# Patient Record
Sex: Female | Born: 1980 | Race: White | Hispanic: No | Smoking: Current every day smoker
Health system: Southern US, Community
[De-identification: ages and names within clinical notes are randomized; demographics above are authoritative.]

## PROBLEM LIST (undated history)

## (undated) DIAGNOSIS — R112 Nausea with vomiting, unspecified: Secondary | ICD-10-CM

## (undated) DIAGNOSIS — N83292 Other ovarian cyst, left side: Secondary | ICD-10-CM

## (undated) DIAGNOSIS — Z720 Tobacco use: Secondary | ICD-10-CM

## (undated) DIAGNOSIS — Z87442 Personal history of urinary calculi: Secondary | ICD-10-CM

## (undated) DIAGNOSIS — F419 Anxiety disorder, unspecified: Secondary | ICD-10-CM

## (undated) DIAGNOSIS — R928 Other abnormal and inconclusive findings on diagnostic imaging of breast: Secondary | ICD-10-CM

## (undated) DIAGNOSIS — Z9889 Other specified postprocedural states: Secondary | ICD-10-CM

## (undated) DIAGNOSIS — N809 Endometriosis, unspecified: Secondary | ICD-10-CM

## (undated) HISTORY — DX: Anxiety disorder, unspecified: F41.9

## (undated) HISTORY — DX: Other abnormal and inconclusive findings on diagnostic imaging of breast: R92.8

## (undated) HISTORY — DX: Other ovarian cyst, left side: N83.292

## (undated) HISTORY — DX: Tobacco use: Z72.0

---
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
## 2005-05-08 ENCOUNTER — Emergency Department: Payer: Self-pay | Admitting: Internal Medicine

## 2005-05-12 HISTORY — PX: OTHER SURGICAL HISTORY: SHX169

## 2005-08-27 ENCOUNTER — Emergency Department: Payer: Self-pay | Admitting: Emergency Medicine

## 2005-11-03 ENCOUNTER — Observation Stay: Payer: Self-pay

## 2005-11-05 ENCOUNTER — Ambulatory Visit: Payer: Self-pay | Admitting: General Surgery

## 2006-03-10 ENCOUNTER — Inpatient Hospital Stay: Payer: Self-pay | Admitting: Obstetrics and Gynecology

## 2006-03-17 ENCOUNTER — Emergency Department: Payer: Self-pay | Admitting: Internal Medicine

## 2006-05-12 HISTORY — PX: CHOLECYSTECTOMY: SHX55

## 2007-05-20 ENCOUNTER — Ambulatory Visit: Payer: Self-pay | Admitting: Family Medicine

## 2007-06-03 ENCOUNTER — Emergency Department: Payer: Self-pay | Admitting: Emergency Medicine

## 2007-06-03 ENCOUNTER — Other Ambulatory Visit: Payer: Self-pay

## 2008-08-07 ENCOUNTER — Emergency Department: Payer: Self-pay | Admitting: Emergency Medicine

## 2008-10-09 ENCOUNTER — Emergency Department: Payer: Self-pay | Admitting: Emergency Medicine

## 2008-10-13 ENCOUNTER — Ambulatory Visit: Payer: Self-pay | Admitting: Surgery

## 2009-01-26 ENCOUNTER — Emergency Department: Payer: Self-pay | Admitting: Emergency Medicine

## 2009-04-06 ENCOUNTER — Emergency Department: Payer: Self-pay | Admitting: Emergency Medicine

## 2014-05-12 HISTORY — PX: OTHER SURGICAL HISTORY: SHX169

## 2014-05-25 ENCOUNTER — Ambulatory Visit: Payer: Self-pay | Admitting: Family Medicine

## 2014-10-10 ENCOUNTER — Emergency Department
Admission: EM | Admit: 2014-10-10 | Discharge: 2014-10-10 | Disposition: A | Discharge status: Home / Self Care | Payer: Medicaid Other | Attending: Emergency Medicine | Admitting: Emergency Medicine

## 2014-10-10 ENCOUNTER — Encounter: Payer: Self-pay | Admitting: Emergency Medicine

## 2014-10-10 DIAGNOSIS — Z720 Tobacco use: Secondary | ICD-10-CM | POA: Diagnosis not present

## 2014-10-10 DIAGNOSIS — N200 Calculus of kidney: Secondary | ICD-10-CM | POA: Diagnosis not present

## 2014-10-10 DIAGNOSIS — Z880 Allergy status to penicillin: Secondary | ICD-10-CM | POA: Insufficient documentation

## 2014-10-10 DIAGNOSIS — R109 Unspecified abdominal pain: Secondary | ICD-10-CM | POA: Diagnosis present

## 2014-10-10 LAB — CBC WITH DIFFERENTIAL/PLATELET
BASOS PCT: 0 %
Basophils Absolute: 0 10*3/uL (ref 0–0.1)
Eosinophils Absolute: 0.1 10*3/uL (ref 0–0.7)
Eosinophils Relative: 1 %
HCT: 41 % (ref 35.0–47.0)
HEMOGLOBIN: 13.4 g/dL (ref 12.0–16.0)
LYMPHS ABS: 0.8 10*3/uL — AB (ref 1.0–3.6)
Lymphocytes Relative: 12 %
MCH: 29 pg (ref 26.0–34.0)
MCHC: 32.7 g/dL (ref 32.0–36.0)
MCV: 88.8 fL (ref 80.0–100.0)
Monocytes Absolute: 0.5 10*3/uL (ref 0.2–0.9)
Monocytes Relative: 6 %
NEUTROS ABS: 5.9 10*3/uL (ref 1.4–6.5)
Neutrophils Relative %: 81 %
Platelets: 207 10*3/uL (ref 150–440)
RBC: 4.62 MIL/uL (ref 3.80–5.20)
RDW: 14.4 % (ref 11.5–14.5)
WBC: 7.3 10*3/uL (ref 3.6–11.0)

## 2014-10-10 LAB — URINALYSIS COMPLETE WITH MICROSCOPIC (ARMC ONLY)
BILIRUBIN URINE: NEGATIVE
Bacteria, UA: NONE SEEN
Glucose, UA: NEGATIVE mg/dL
KETONES UR: NEGATIVE mg/dL
Leukocytes, UA: NEGATIVE
Nitrite: NEGATIVE
PH: 6 (ref 5.0–8.0)
Protein, ur: 30 mg/dL — AB
Specific Gravity, Urine: 1.02 (ref 1.005–1.030)

## 2014-10-10 LAB — COMPREHENSIVE METABOLIC PANEL
ALK PHOS: 35 U/L — AB (ref 38–126)
ALT: 14 U/L (ref 14–54)
ANION GAP: 10 (ref 5–15)
AST: 25 U/L (ref 15–41)
Albumin: 4.5 g/dL (ref 3.5–5.0)
BILIRUBIN TOTAL: 0.6 mg/dL (ref 0.3–1.2)
BUN: 17 mg/dL (ref 6–20)
CHLORIDE: 108 mmol/L (ref 101–111)
CO2: 22 mmol/L (ref 22–32)
Calcium: 10 mg/dL (ref 8.9–10.3)
Creatinine, Ser: 0.88 mg/dL (ref 0.44–1.00)
GFR calc non Af Amer: >60 mL/min (ref >60)
Glucose, Bld: 154 mg/dL — ABNORMAL HIGH (ref 65–99)
POTASSIUM: 3.3 mmol/L — AB (ref 3.5–5.1)
SODIUM: 140 mmol/L (ref 135–145)
Total Protein: 7.8 g/dL (ref 6.5–8.1)

## 2014-10-10 LAB — LIPASE, BLOOD: Lipase: 44 U/L (ref 22–51)

## 2014-10-10 MED ORDER — ONDANSETRON HCL 4 MG/2ML IJ SOLN
4.0000 mg | Freq: Once | INTRAMUSCULAR | Status: AC
  Administered 2014-10-10: 4 mg via INTRAVENOUS

## 2014-10-10 MED ORDER — MORPHINE SULFATE 4 MG/ML IJ SOLN
INTRAMUSCULAR | Status: AC
  Filled 2014-10-10: qty 1

## 2014-10-10 MED ORDER — MORPHINE SULFATE 4 MG/ML IJ SOLN: 4.0000 mg | Freq: Once | INTRAMUSCULAR | Status: DC

## 2014-10-10 MED ORDER — ONDANSETRON HCL 4 MG/2ML IJ SOLN
INTRAMUSCULAR | Status: AC
  Administered 2014-10-10: 4 mg via INTRAVENOUS
  Filled 2014-10-10: qty 2

## 2014-10-10 NOTE — ED Provider Notes (Signed)
[facility] Emergency Department Provider Note  ____________________________________________  Time seen: On arrival  I have reviewed the triage vital signs and the nursing notes.   HISTORY  Chief Complaint Abdominal Pain      HPI Taylor Schwartz is a 34 y.o. female who presents with right flank pain. The pain started approximately 6 hours prior to arrival while patient was sleeping. It began abruptly and is noted to be very sharp in nature. The pain comes and goes in intensity but when severe she feels nauseous.No fevers no chills. No history of the same. She has had a cholecystectomy. Nothing seems to make the pain better or worse     History reviewed. No pertinent past medical history.  There are no active problems to display for this patient.   Past Surgical History  Procedure Laterality Date  . Cholecystectomy      No current outpatient prescriptions on file.  Allergies Amoxicillin; Mobic; and Penicillins  No family history on file.  Social History History  Substance Use Topics  . Smoking status: Current Every Day Smoker  . Smokeless tobacco: Not on file  . Alcohol Use: No    Review of Systems  Constitutional: Negative for fever. Eyes: Negative for visual changes. ENT: Negative for sore throat Cardiovascular: Negative for chest pain. Respiratory: Negative for shortness of breath. Gastrointestinal: Positive for flank pain Genitourinary: Negative for dysuria. Musculoskeletal: Negative for back pain. Skin: Negative for rash. Neurological: Negative for headaches or focal weakness   10-point ROS otherwise negative.  ____________________________________________   PHYSICAL EXAM:  VITAL SIGNS: ED Triage Vitals  Enc Vitals Group     BP 10/10/14 0906 110/73 mmHg     Pulse Rate 10/10/14 0906 79     Resp 10/10/14 0906 18     Temp 10/10/14 0906 97.5 F (36.4 C)     Temp Source 10/10/14 0906 Oral     SpO2 10/10/14 0906 100 %      Weight 10/10/14 0906 134 lb (60.782 kg)     Height 10/10/14 0906 5\' 5"  (1.651 m)     Head Cir --      Peak Flow --      Pain Score 10/10/14 0912 10     Pain Loc --      Pain Edu? --      Excl. in [place]? --      Constitutional: Alert and oriented. Well appearing but uncomfortable Eyes: Conjunctivae are normal. PERRL. ENT   Head: Normocephalic and atraumatic.   Nose: No rhinnorhea.   Mouth/Throat: Mucous membranes are moist. Cardiovascular: Normal rate, regular rhythm. Normal and symmetric distal pulses are present in all extremities. No murmurs, rubs, or gallops. Respiratory: Normal respiratory effort without tachypnea nor retractions. Breath sounds are clear and equal bilaterally.  Gastrointestinal: Soft and non-tender in all quadrants. No distention. There is no CVA tenderness. Genitourinary: deferred Musculoskeletal: Nontender with normal range of motion in all extremities. No lower extremity tenderness nor edema. Neurologic:  Normal speech and language. No gross focal neurologic deficits are appreciated. Skin:  Skin is warm, dry and intact. No rash noted. Psychiatric: Mood and affect are normal. Patient exhibits appropriate insight and judgment.  ____________________________________________    LABS (pertinent positives/negatives)  Labs Reviewed  COMPREHENSIVE METABOLIC PANEL - Abnormal; Notable for the following:    Potassium 3.3 (*)    Glucose, Bld 154 (*)    Alkaline Phosphatase 35 (*)    All other components within normal limits  URINALYSIS COMPLETEWITH  MICROSCOPIC ([facility] ONLY) - Abnormal; Notable for the following:    Color, Urine YELLOW (*)    APPearance HAZY (*)    Hgb urine dipstick 3+ (*)    Protein, ur 30 (*)    Squamous Epithelial / LPF 0-5 (*)    All other components within normal limits  CBC WITH DIFFERENTIAL/PLATELET - Abnormal; Notable for the following:    Lymphs Abs 0.8 (*)    All other components within normal limits  LIPASE, BLOOD     ____________________________________________   EKG  None  ____________________________________________    RADIOLOGY  None  ____________________________________________   PROCEDURES  Procedure(s) performed: none  Critical Care performed: none  ____________________________________________   INITIAL IMPRESSION / ASSESSMENT AND PLAN / ED COURSE  Pertinent labs & imaging results that were available during my care of the patient were reviewed by me and considered in my medical decision making (see chart for details).  While in the emergency department patient urinated and the pain improved. Kidney stone noted in urine  ____________________________________________   FINAL CLINICAL IMPRESSION(S) / ED DIAGNOSES  Final diagnoses:  Kidney stone     Lavonia Drafts, MD 10/10/14 1537

## 2014-10-10 NOTE — Discharge Instructions (Signed)

## 2014-10-10 NOTE — ED Notes (Signed)
Pt with acute onset RUQ pain started this am  With vomiting and diarrhea.

## 2014-10-17 ENCOUNTER — Telehealth: Payer: Self-pay | Admitting: Family Medicine

## 2014-10-17 DIAGNOSIS — F418 Other specified anxiety disorders: Secondary | ICD-10-CM

## 2014-10-17 MED ORDER — ALPRAZOLAM 0.5 MG PO TABS: 0.5000 mg | ORAL_TABLET | Freq: Two times a day (BID) | ORAL | Status: DC | PRN

## 2014-10-17 NOTE — Telephone Encounter (Signed)
FAX received regarding refill for alprazolam 0.5mg  one po bid, quantity #60 with 5 refills RX printed out and ready for patient to pick up please notify patient and close this note.

## 2014-10-17 NOTE — Telephone Encounter (Signed)
Patient is informed and will be by tomorrow to pick up her rx.

## 2015-07-06 DIAGNOSIS — K219 Gastro-esophageal reflux disease without esophagitis: Secondary | ICD-10-CM | POA: Insufficient documentation

## 2015-07-06 DIAGNOSIS — M5412 Radiculopathy, cervical region: Secondary | ICD-10-CM | POA: Insufficient documentation

## 2015-07-06 DIAGNOSIS — F339 Major depressive disorder, recurrent, unspecified: Secondary | ICD-10-CM | POA: Insufficient documentation

## 2015-07-06 DIAGNOSIS — D229 Melanocytic nevi, unspecified: Secondary | ICD-10-CM | POA: Insufficient documentation

## 2016-01-28 DIAGNOSIS — N6012 Diffuse cystic mastopathy of left breast: Secondary | ICD-10-CM | POA: Insufficient documentation

## 2016-04-15 ENCOUNTER — Other Ambulatory Visit: Payer: Self-pay | Admitting: Obstetrics and Gynecology

## 2016-05-12 DIAGNOSIS — R928 Other abnormal and inconclusive findings on diagnostic imaging of breast: Secondary | ICD-10-CM

## 2016-05-12 DIAGNOSIS — N809 Endometriosis, unspecified: Secondary | ICD-10-CM

## 2016-05-12 HISTORY — DX: Other abnormal and inconclusive findings on diagnostic imaging of breast: R92.8

## 2016-05-12 HISTORY — PX: BACK SURGERY: SHX140

## 2016-05-12 HISTORY — PX: BREAST SURGERY: SHX581

## 2016-05-12 HISTORY — DX: Endometriosis, unspecified: N80.9

## 2016-07-01 ENCOUNTER — Encounter: Payer: Self-pay | Admitting: Family Medicine

## 2016-07-01 ENCOUNTER — Ambulatory Visit (INDEPENDENT_AMBULATORY_CARE_PROVIDER_SITE_OTHER): Payer: Medicaid Other | Admitting: Family Medicine

## 2016-07-01 ENCOUNTER — Emergency Department: Payer: Medicaid Other

## 2016-07-01 ENCOUNTER — Emergency Department
Admission: EM | Admit: 2016-07-01 | Discharge: 2016-07-01 | Disposition: A | Discharge status: Home / Self Care | Payer: Medicaid Other | Attending: Emergency Medicine | Admitting: Emergency Medicine

## 2016-07-01 ENCOUNTER — Encounter: Payer: Self-pay | Admitting: Medical Oncology

## 2016-07-01 VITALS — BP 118/80 | HR 99 | Temp 99.2°F | Resp 16 | Wt 147.3 lb

## 2016-07-01 DIAGNOSIS — F17200 Nicotine dependence, unspecified, uncomplicated: Secondary | ICD-10-CM | POA: Diagnosis not present

## 2016-07-01 DIAGNOSIS — R1031 Right lower quadrant pain: Secondary | ICD-10-CM | POA: Diagnosis not present

## 2016-07-01 DIAGNOSIS — K529 Noninfective gastroenteritis and colitis, unspecified: Secondary | ICD-10-CM | POA: Diagnosis not present

## 2016-07-01 DIAGNOSIS — R109 Unspecified abdominal pain: Secondary | ICD-10-CM

## 2016-07-01 LAB — COMPREHENSIVE METABOLIC PANEL
ALK PHOS: 30 U/L — AB (ref 38–126)
ALT: 12 U/L — ABNORMAL LOW (ref 14–54)
AST: 21 U/L (ref 15–41)
Albumin: 4.1 g/dL (ref 3.5–5.0)
Anion gap: 7 (ref 5–15)
BUN: 12 mg/dL (ref 6–20)
CALCIUM: 9.3 mg/dL (ref 8.9–10.3)
CO2: 22 mmol/L (ref 22–32)
Chloride: 108 mmol/L (ref 101–111)
Creatinine, Ser: 0.77 mg/dL (ref 0.44–1.00)
GFR calc Af Amer: >60 mL/min (ref >60)
GFR calc non Af Amer: >60 mL/min (ref >60)
Glucose, Bld: 97 mg/dL (ref 65–99)
Potassium: 3.5 mmol/L (ref 3.5–5.1)
Sodium: 137 mmol/L (ref 135–145)
Total Bilirubin: 0.3 mg/dL (ref 0.3–1.2)
Total Protein: 7.4 g/dL (ref 6.5–8.1)

## 2016-07-01 LAB — URINALYSIS, COMPLETE (UACMP) WITH MICROSCOPIC
Bacteria, UA: NONE SEEN
Bilirubin Urine: NEGATIVE
Glucose, UA: NEGATIVE mg/dL
Ketones, ur: 20 mg/dL — AB
Nitrite: NEGATIVE
PROTEIN: NEGATIVE mg/dL
Specific Gravity, Urine: 1.023 (ref 1.005–1.030)
pH: 5 (ref 5.0–8.0)

## 2016-07-01 LAB — CBC
HCT: 38.2 % (ref 35.0–47.0)
Hemoglobin: 12.8 g/dL (ref 12.0–16.0)
MCH: 29 pg (ref 26.0–34.0)
MCHC: 33.5 g/dL (ref 32.0–36.0)
MCV: 86.5 fL (ref 80.0–100.0)
Platelets: 286 10*3/uL (ref 150–440)
RBC: 4.42 MIL/uL (ref 3.80–5.20)
RDW: 13.5 % (ref 11.5–14.5)
WBC: 11.6 10*3/uL — ABNORMAL HIGH (ref 3.6–11.0)

## 2016-07-01 LAB — LIPASE, BLOOD: Lipase: 29 U/L (ref 11–51)

## 2016-07-01 LAB — LACTIC ACID, PLASMA: Lactic Acid, Venous: 0.8 mmol/L (ref 0.5–1.9)

## 2016-07-01 LAB — POCT PREGNANCY, URINE: PREG TEST UR: NEGATIVE

## 2016-07-01 MED ORDER — SODIUM CHLORIDE 0.9 % IV BOLUS (SEPSIS)
1000.0000 mL | INTRAVENOUS | Status: AC
  Administered 2016-07-01: 1000 mL via INTRAVENOUS

## 2016-07-01 MED ORDER — ONDANSETRON 4 MG PO TBDP: 4.0000 mg | ORAL_TABLET | Freq: Three times a day (TID) | ORAL | 0 refills | Status: DC | PRN

## 2016-07-01 MED ORDER — IOPAMIDOL (ISOVUE-300) INJECTION 61%
30.0000 mL | Freq: Once | INTRAVENOUS | Status: AC | PRN
  Administered 2016-07-01: 30 mL via ORAL
  Filled 2016-07-01: qty 30

## 2016-07-01 MED ORDER — IOPAMIDOL (ISOVUE-300) INJECTION 61%
100.0000 mL | Freq: Once | INTRAVENOUS | Status: AC | PRN
  Administered 2016-07-01: 100 mL via INTRAVENOUS
  Filled 2016-07-01: qty 100

## 2016-07-01 MED ORDER — HYDROCODONE-ACETAMINOPHEN 5-325 MG PO TABS: 1.0000 | ORAL_TABLET | ORAL | 0 refills | Status: DC | PRN

## 2016-07-01 MED ORDER — ONDANSETRON HCL 4 MG/2ML IJ SOLN
4.0000 mg | INTRAMUSCULAR | Status: AC
  Administered 2016-07-01: 4 mg via INTRAVENOUS
  Filled 2016-07-01: qty 2

## 2016-07-01 MED ORDER — MORPHINE SULFATE (PF) 4 MG/ML IV SOLN
4.0000 mg | Freq: Once | INTRAVENOUS | Status: AC
  Administered 2016-07-01: 4 mg via INTRAVENOUS
  Filled 2016-07-01: qty 1

## 2016-07-01 NOTE — Progress Notes (Signed)
erronous encounter

## 2016-07-01 NOTE — ED Triage Notes (Signed)
Pt reports she was sent here from PCP to be evaluated for appendicitis, pt states that she began having rt lower abd pain and fever Thursday. Denies dysuria.

## 2016-07-01 NOTE — ED Provider Notes (Signed)
[facility] Emergency Department Provider Note  ____________________________________________   First MD Initiated Contact with Patient 07/01/16 1624     (approximate)  I have reviewed the triage vital signs and the nursing notes.   HISTORY  Chief Complaint Abdominal Pain and Fever    HPI Taylor Schwartz is a 36 y.o. female with a history of kidney stones who presents for evaluation of about 5 days of gradually worsening right-sided abdominal pain with decreased appetite, decreased oral intake, nausea, vomiting, and fever as high as 104 measured at home.  She states that the pain started around her belly button and has migrated down to her right lower quadrant.  The pain is mild when she presses but when somebody presses and then lets go, the pain is severe.  She says that she has no appetite but whenever she does eat she becomes very nauseated and occasionally vomits.  Her fever is helped somewhat by 800 mg ibuprofen and Tylenol, but then the fever comes back.  She went to see her primary care doctor today and after the evaluation was told to go immediately to the emergency department.  She reports that she was febrile at her doctor's office but she was not in triage.  She denies chest pain, shortness of breath, dysuria, hematuria.  Movement makes the pain much worse and nothing makes it better.  It feels sharp and stabbing as well as aching and is severe.  History reviewed. No pertinent past medical history.  Patient Active Problem List   Diagnosis Date Noted  . Depression with anxiety 10/17/2014    Past Surgical History:  Procedure Laterality Date  . CHOLECYSTECTOMY      Prior to Admission medications   Medication Sig Start Date End Date Taking? Authorizing Provider  ALPRAZolam Duanne Moron) 0.5 MG tablet Take 1 tablet (0.5 mg total) by mouth 2 (two) times daily as needed for anxiety. 10/17/14   Bobetta Lime, MD    Allergies Amoxicillin; Mobic  [meloxicam]; and Penicillins  No family history on file.  Social History Social History  Substance Use Topics  . Smoking status: Current Every Day Smoker  . Smokeless tobacco: Never Used  . Alcohol use No    Review of Systems Constitutional: +fever/chills, Tmax of 104 Eyes: No visual changes. ENT: No sore throat. Cardiovascular: Denies chest pain. Respiratory: Denies shortness of breath. Gastrointestinal: Abd pain that started around the umbilicus and has migrated to the RLQ.  +N/V.  +Anorexia Genitourinary: Negative for dysuria. Musculoskeletal: Negative for back pain. Skin: Negative for rash. Neurological: Negative for headaches, focal weakness or numbness.  10-point ROS otherwise negative.  ____________________________________________   PHYSICAL EXAM:  VITAL SIGNS: ED Triage Vitals  Enc Vitals Group     BP 07/01/16 1500 132/68     Pulse Rate 07/01/16 1500 92     Resp 07/01/16 1500 18     Temp 07/01/16 1500 98.7 F (37.1 C)     Temp Source 07/01/16 1500 Oral     SpO2 07/01/16 1500 100 %     Weight 07/01/16 1500 147 lb (66.7 kg)     Height 07/01/16 1500 5\' 6"  (1.676 m)     Head Circumference --      Peak Flow --      Pain Score 07/01/16 1501 8     Pain Loc --      Pain Edu? --      Excl. in [place]? --     Constitutional: Alert and oriented.  Appear uncomfortable but non-toxic Eyes: Conjunctivae are normal. PERRL. EOMI. Head: Atraumatic. Nose: No congestion/rhinnorhea. Mouth/Throat: Mucous membranes are moist. Neck: No stridor.  No meningeal signs.   Cardiovascular: Borderline tachycardia, regular rhythm. Good peripheral circulation. Grossly normal heart sounds. Respiratory: Normal respiratory effort.  No retractions. Lungs CTAB. Gastrointestinal: Soft with moderate tenderness to palpation of the RLQ but severe rebound tenderness.  +Rovsings.  Also has +Murphy's sign  in RUQ, but has history of cholecystectomy about 8 or 9 years ago Genitourinary:  Deferred Musculoskeletal: No lower extremity tenderness nor edema. No gross deformities of extremities. Neurologic:  Normal speech and language. No gross focal neurologic deficits are appreciated.  Skin:  Skin is warm, dry and intact. No rash noted. Psychiatric: Mood and affect are normal. Speech and behavior are normal.  ____________________________________________   LABS (all labs ordered are listed, but only abnormal results are displayed)  Labs Reviewed  COMPREHENSIVE METABOLIC PANEL - Abnormal; Notable for the following:       Result Value   ALT 12 (*)    Alkaline Phosphatase 30 (*)    All other components within normal limits  CBC - Abnormal; Notable for the following:    WBC 11.6 (*)    All other components within normal limits  URINALYSIS, COMPLETE (UACMP) WITH MICROSCOPIC - Abnormal; Notable for the following:    Color, Urine YELLOW (*)    APPearance CLEAR (*)    Hgb urine dipstick SMALL (*)    Ketones, ur 20 (*)    Leukocytes, UA TRACE (*)    Squamous Epithelial / LPF 0-5 (*)    All other components within normal limits  LIPASE, BLOOD  LACTIC ACID, PLASMA  LACTIC ACID, PLASMA  POC URINE PREG, ED  POCT PREGNANCY, URINE   ____________________________________________  EKG  None - EKG not ordered by ED physician ____________________________________________  RADIOLOGY   No results found.  ____________________________________________   PROCEDURES  Procedure(s) performed:   Procedures   Critical Care performed: No ____________________________________________   INITIAL IMPRESSION / ASSESSMENT AND PLAN / ED COURSE  Pertinent labs & imaging results that were available during my care of the patient were reviewed by me and considered in my medical decision making (see chart for details).  The patient has a heart rate greater than 90 but she is not technically tachycardic.  She has a very mild leukocytosis technically because it is over 11 but I do not  believe she qualifies for sepsis at this point.  However I am concerned about her focal peritonitis in the right lower quadrant.  She does also have significant tenderness to palpation of the right upper quadrant but she is status post cholecystectomy nearly a decade ago and that makes a diagnosis such as cholangitis incredibly unlikely given the limited possibility of retained stone after so long.  Appendicitis plus or minus perforation is much more likely.  I have asked to send a lactic acid and will provide IV fluids, morphine, Zofran, and we will evaluate with CT scan with oral and IV contrast.  Transferring ED care to Dr. Kerman Passey at 5:00pm to follow up on imaging and reassess.    ____________________________________________  FINAL CLINICAL IMPRESSION(S) / ED DIAGNOSES  Final diagnoses:  None     MEDICATIONS GIVEN DURING THIS VISIT:  Medications  morphine 4 MG/ML injection 4 mg (4 mg Intravenous Given 07/01/16 1702)  ondansetron (ZOFRAN) injection 4 mg (4 mg Intravenous Given 07/01/16 1700)  sodium chloride 0.9 % bolus 1,000 mL (1,000 mLs Intravenous  New Bag/Given 07/01/16 1658)  iopamidol (ISOVUE-300) 61 % injection 30 mL (30 mLs Oral Contrast Given 07/01/16 1642)     NEW OUTPATIENT MEDICATIONS STARTED DURING THIS VISIT:  New Prescriptions   No medications on file    Modified Medications   No medications on file    Discontinued Medications   No medications on file     Note:  This document was prepared using Dragon voice recognition software and may include unintentional dictation errors.    Hinda Kehr, MD 07/01/16 [phone]

## 2016-07-01 NOTE — Progress Notes (Signed)
   BP 118/80   Pulse 99   Temp 99.2 F (37.3 C) (Oral)   Resp 16   Wt 147 lb 4.8 oz (66.8 kg)   LMP 06/13/2016   SpO2 96%   BMI 24.51 kg/m    Subjective:    Patient ID: Taylor Schwartz, female    DOB: 04-21-1981, 36 y.o.   MRN: [id number]  HPI: Taylor Schwartz is a 36 y.o. female  Chief Complaint  Patient presents with  . Abdominal Pain    more RLQ has had urology workup, pain has gotten worse has also been having    Patient was worked up by the Psychologist, sport and exercise, and came to get me quickly when she realized that this patient may have appendicitis or other significant illness Patient in the room, c/o of significant abdominal pain, periumbilical and RLQ; she says it really hurts when "letting go", worse than when pushing down  Relevant past surgical, social history reviewed No past medical history on file.   Past Surgical History:  Procedure Laterality Date  . CHOLECYSTECTOMY     No family history on file.   Social History  Substance Use Topics  . Smoking status: Current Every Day Smoker  . Smokeless tobacco: Never Used  . Alcohol use No   Interim medical history since last visit reviewed. Allergies and medications reviewed  Review of Systems Per HPI unless specifically indicated above     Objective:    BP 118/80   Pulse 99   Temp 99.2 F (37.3 C) (Oral)   Resp 16   Wt 147 lb 4.8 oz (66.8 kg)   LMP 06/13/2016   SpO2 96%   BMI 24.51 kg/m   Wt Readings from Last 3 Encounters:  07/01/16 147 lb (66.7 kg)  07/01/16 147 lb 4.8 oz (66.8 kg)  10/10/14 134 lb (60.8 kg)    Physical Exam  Constitutional: She appears well-developed and well-nourished.  Appears to be ill, in obvious discomfort, but nontoxic  Cardiovascular: Normal rate.   Pulmonary/Chest: Effort normal.  Abdominal: She exhibits no distension. There is tenderness in the right lower quadrant and periumbilical area. There is guarding.  Skin: She is not diaphoretic.      Assessment & Plan:    Problem List Items Addressed This Visit    None    Visit Diagnoses    Right lower quadrant abdominal pain    -  Primary   and periumbilical; ddx includes acute appendicitis; patient urged to go straight to ER; call report given; she reported fine to drive, declined EMS, ride      Follow up plan: No Follow-up on file.

## 2016-07-01 NOTE — ED Provider Notes (Signed)
-----------------------------------------   6:31 PM on 07/01/2016 -----------------------------------------  Patient's workup is largely within normal limits besides a slight leukocytosis. CT scan is negative showing a normal appendix. Lactic acid is normal. Highly suspect GI viral illness. We will discharge with a short course of Norco as well as Zofran. I discussed supportive care at home.   Harvest Dark, MD 07/01/16 [phone]

## 2016-07-04 ENCOUNTER — Encounter: Payer: Self-pay | Admitting: Family Medicine

## 2016-07-04 ENCOUNTER — Ambulatory Visit (INDEPENDENT_AMBULATORY_CARE_PROVIDER_SITE_OTHER): Payer: Medicaid Other | Admitting: Family Medicine

## 2016-07-04 DIAGNOSIS — R109 Unspecified abdominal pain: Secondary | ICD-10-CM | POA: Insufficient documentation

## 2016-07-04 DIAGNOSIS — R1031 Right lower quadrant pain: Secondary | ICD-10-CM

## 2016-07-04 DIAGNOSIS — R197 Diarrhea, unspecified: Secondary | ICD-10-CM | POA: Insufficient documentation

## 2016-07-04 DIAGNOSIS — Z720 Tobacco use: Secondary | ICD-10-CM | POA: Diagnosis not present

## 2016-07-04 DIAGNOSIS — A09 Infectious gastroenteritis and colitis, unspecified: Secondary | ICD-10-CM | POA: Diagnosis not present

## 2016-07-04 DIAGNOSIS — N83292 Other ovarian cyst, left side: Secondary | ICD-10-CM | POA: Diagnosis not present

## 2016-07-04 HISTORY — DX: Tobacco use: Z72.0

## 2016-07-04 HISTORY — DX: Other ovarian cyst, left side: N83.292

## 2016-07-04 MED ORDER — RANITIDINE HCL 150 MG PO TABS: 150.0000 mg | ORAL_TABLET | Freq: Two times a day (BID) | ORAL | 0 refills | Status: DC

## 2016-07-04 MED ORDER — HYOSCYAMINE SULFATE SL 0.125 MG SL SUBL: SUBLINGUAL_TABLET | SUBLINGUAL | 0 refills | Status: DC

## 2016-07-04 NOTE — Assessment & Plan Note (Signed)
Patient is not ready to quit; I planted the seed, I am here to help if/when needed

## 2016-07-04 NOTE — Assessment & Plan Note (Signed)
Refer to GYN. 

## 2016-07-04 NOTE — Assessment & Plan Note (Signed)
Will get stool studies

## 2016-07-04 NOTE — Progress Notes (Signed)
BP 118/67   Pulse 98   Temp 98.9 F (37.2 C) (Oral)   Resp 14   Wt 141 lb 9.6 oz (64.2 kg)   LMP 06/13/2016 Comment: neg preg test 07/01/16  SpO2 99%   BMI 22.85 kg/m    Subjective:    Patient ID: Taylor Schwartz, female    DOB: 06-21-1980, 36 y.o.   MRN: [id number]  HPI: Taylor Schwartz is a 36 y.o. female  Chief Complaint  Patient presents with  . Follow-up    ER   Patient was seen in the ER on Jul 01, 2016, had CT abdomen and pelvis Note from ER says that patient likely had GI viral illness Sent home with norco and zofran She was seen here but then was quickly transferred to the ER because of her clinical exam (rebound) Symptoms started a week ago; she is having diarrhea Rest of the family is doing well, no one else at home is sick No vomiting but on the zofran Last fever was Saturday, 99 to 100 low grade; not taking ibuprofen; stomach cramping; maybe nothing there Rolling and gurgling Things just go through her, 2-3 x a day; no blood or mucous Stool is greenish in color Right after she eats, then 1-2 hours later, things will go through her Cholecystectomy 8 years ago  ------------------------------------------ Reproductive: Uterus unremarkable. Small cyst in the right ovary unchanged. Hypodense 2.5 cm probable complicated cyst left ovary, unchanged compared with December 2017 CT.  IMPRESSION: 1. No CT evidence for acute intra-abdominal or pelvic pathology. Normal appendix. 2. Status post cholecystectomy. Mild intra hepatic biliary dilatation with prominent extrahepatic common bile duct up to 1 cm, likely related to post cholecystectomy changes. Consider correlation with laboratory values. 3. Punctate nonobstructing stone mid right kidney.   Electronically Signed   By: Donavan Foil M.D.   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 Anxiety   . Complex cyst of left ovary 07/04/2016  . Kidney stones   . Tobacco abuse 07/04/2016   Past Surgical History:  Procedure Laterality Date  . CHOLECYSTECTOMY     Family History  Problem Relation Age of Onset  . Lupus Mother   . Arthritis Mother   . Glaucoma Mother   . Vision loss Mother   . Diabetes Mother   . Cancer Father     prostate  . Diabetes Father   . Diabetes Maternal Grandmother   . Dementia Maternal Grandmother   . Diabetes Maternal Grandfather   . Pneumonia Maternal Grandfather   . Diabetes Paternal Grandmother   . Diabetes Paternal Grandfather    Social History  Substance Use Topics  . Smoking status: Current Every Day Smoker  . Smokeless tobacco:  Never Used  . Alcohol use No   Interim medical history since last visit reviewed. Allergies and medications reviewed  Review of Systems Per HPI unless specifically indicated above     Objective:    BP 118/67   Pulse 98   Temp 98.9 F (37.2 C) (Oral)   Resp 14   Wt 141 lb 9.6 oz (64.2 kg)   LMP 06/13/2016 Comment: neg preg test 07/01/16  SpO2 99%   BMI 22.85 kg/m   Wt Readings from Last 3 Encounters:  07/04/16 141 lb 9.6 oz (64.2 kg)  07/01/16 147 lb (66.7 kg)  07/01/16 147 lb 4.8 oz (66.8 kg)    Physical Exam  Constitutional: She appears well-developed and  well-nourished. No distress.  Weight loss six pounds  HENT:  Head: Normocephalic and atraumatic.  Eyes: EOM are normal. No scleral icterus.  Neck: No thyromegaly present.  Cardiovascular: Normal rate, regular rhythm and normal heart sounds.   No murmur heard. Pulmonary/Chest: Effort normal and breath sounds normal. No respiratory distress. She has no wheezes.  Abdominal: Soft. Bowel sounds are normal. She exhibits no distension. There is tenderness (very mildly tender RLQ, no guarding, no rebound). There is no rebound and no guarding.  Musculoskeletal: Normal range of motion. She exhibits no edema.  Neurological: She is alert.  Skin: Skin is warm and dry. She is not diaphoretic. No pallor.  Psychiatric: She has a normal mood and affect. Her behavior is normal. Judgment and thought content normal.   Results for orders placed or performed during the hospital encounter of 07/01/16  Lipase, blood  Result Value Ref Range   Lipase 29 11 - 51 U/L  Comprehensive metabolic panel  Result Value Ref Range   Sodium 137 135 - 145 mmol/L   Potassium 3.5 3.5 - 5.1 mmol/L   Chloride 108 101 - 111 mmol/L   CO2 22 22 - 32 mmol/L   Glucose, Bld 97 65 - 99 mg/dL   BUN 12 6 - 20 mg/dL   Creatinine, Ser 0.77 0.44 - 1.00 mg/dL   Calcium 9.3 8.9 - 10.3 mg/dL   Total Protein 7.4 6.5 - 8.1 g/dL   Albumin 4.1 3.5 - 5.0 g/dL   AST 21 15 - 41 U/L   ALT 12 (L) 14 - 54 U/L   Alkaline Phosphatase 30 (L) 38 - 126 U/L   Total Bilirubin 0.3 0.3 - 1.2 mg/dL   GFR calc non Af Amer >60 >60 mL/min   GFR calc Af Amer >60 >60 mL/min   Anion gap 7 5 - 15  CBC  Result Value Ref Range   WBC 11.6 (H) 3.6 - 11.0 K/uL   RBC 4.42 3.80 - 5.20 MIL/uL   Hemoglobin 12.8 12.0 - 16.0 g/dL   HCT 38.2 35.0 - 47.0 %   MCV 86.5 80.0 - 100.0 fL   MCH 29.0 26.0 - 34.0 pg   MCHC 33.5 32.0 - 36.0 g/dL   RDW 13.5 11.5 - 14.5 %   Platelets 286 150 - 440 K/uL  Urinalysis, Complete w Microscopic  Result Value Ref Range   Color,  Urine YELLOW (A) YELLOW   APPearance CLEAR (A) CLEAR   Specific Gravity, Urine 1.023 1.005 - 1.030   pH 5.0 5.0 - 8.0   Glucose, UA NEGATIVE NEGATIVE mg/dL   Hgb urine dipstick SMALL (A) NEGATIVE   Bilirubin Urine NEGATIVE NEGATIVE   Ketones, ur 20 (A) NEGATIVE mg/dL   Protein, ur NEGATIVE NEGATIVE mg/dL   Nitrite  NEGATIVE NEGATIVE   Leukocytes, UA TRACE (A) NEGATIVE   RBC / HPF 0-5 0 - 5 RBC/hpf   WBC, UA 0-5 0 - 5 WBC/hpf   Bacteria, UA NONE SEEN NONE SEEN   Squamous Epithelial / LPF 0-5 (A) NONE SEEN   Mucous PRESENT   Lactic acid, plasma  Result Value Ref Range   Lactic Acid, Venous 0.8 0.5 - 1.9 mmol/L  Pregnancy, urine POC  Result Value Ref Range   Preg Test, Ur NEGATIVE NEGATIVE      Assessment & Plan:   Problem List Items Addressed This Visit      Genitourinary   Complex cyst of left ovary    Refer to GYN        Other   Tobacco abuse    Patient is not ready to quit; I planted the seed, I am here to help if/when needed      Diarrhea    Will get stool studies      Relevant Orders   Stool culture   Ova and parasite examination   Abdominal pain    Reviewed CT scans from Feb and Dec with her; much improved relative to three days ago; if viral GE, this should resolve; will get stool studies; complex cyst is on LEFT, not where she feels discomfort; will use ranitidine to help prevent gastritis, ulcer; levsin for cramping; back to ER if worse over weekend; call me Monday if still having symptoms         Follow up plan: No Follow-up on file.  An after-visit summary was printed and given to the patient at [facility].  Please see the patient instructions which may contain other information and recommendations beyond what is mentioned above in the assessment and plan.  Meds ordered this encounter  Medications  . Norgestimate-Ethinyl Estradiol Triphasic 0.18/0.215/0.25 MG-25 MCG tab    Sig: Take 1 tablet by mouth daily.  Marland Kitchen ibuprofen (ADVIL,MOTRIN) 800 MG tablet      Sig: Take 800 mg by mouth as needed.  . ranitidine (ZANTAC) 150 MG tablet    Sig: Take 1 tablet (150 mg total) by mouth 2 (two) times daily.    Dispense:  60 tablet    Refill:  0  . Hyoscyamine Sulfate SL (LEVSIN/SL) 0.125 MG SUBL    Sig: One by mouth every six hours if needed for stomach upset, cramps    Dispense:  30 each    Refill:  0    Orders Placed This Encounter  Procedures  . Stool culture  . Ova and parasite examination

## 2016-07-04 NOTE — Patient Instructions (Signed)
Continue bland / BRAT diet Hydrate Stool studies Ranitidine to help prevent gastritis or an ulcer levsin if needed for stomach cramping Call if needed Back to ER if worsening, dehydrated, unable to keep foods down

## 2016-07-04 NOTE — Assessment & Plan Note (Addendum)
Reviewed CT scans from Feb and Dec with her; much improved relative to three days ago; if viral GE, this should resolve; will get stool studies; complex cyst is on LEFT, not where she feels discomfort; will use ranitidine to help prevent gastritis, ulcer; levsin for cramping; back to ER if worse over weekend; call me Monday if still having symptoms

## 2016-07-10 LAB — OVA AND PARASITE EXAMINATION: OP: NONE SEEN

## 2016-07-11 ENCOUNTER — Telehealth: Payer: Self-pay | Admitting: Family Medicine

## 2016-07-11 NOTE — Telephone Encounter (Signed)
I am so sorry to hear that she is still having such significant symptoms Since she is having "severe" pain, please direct her to go back to the ER now (she did have an elevated white count, could have been smoldering appendix, some other issue that is declaring itself now)

## 2016-07-11 NOTE — Telephone Encounter (Deleted)
-----   Message from Arnetha Courser, MD sent at 07/11/2016  8:07 AM EST ----- Please let patient know that her stool did not show any ova (eggs) or parasites; thank you

## 2016-07-11 NOTE — Telephone Encounter (Signed)
Patient notified and informed her to go to ER

## 2016-07-11 NOTE — Telephone Encounter (Signed)
Patient called wanting to know the result of her recent stool test.  I gave her the results per what Dr. Sanda Klein noted in the results.  Patient stated that she is no longer vomiting or has diarrhea.  She is however still experiencing severe right side pain. She is taking ibuprofen, xantac, and hyoscyamine but the pain has not resolved.  Please advise.

## 2016-07-12 LAB — STOOL CULTURE

## 2016-08-19 ENCOUNTER — Other Ambulatory Visit: Payer: Self-pay

## 2016-08-19 DIAGNOSIS — N83209 Unspecified ovarian cyst, unspecified side: Secondary | ICD-10-CM

## 2016-09-15 ENCOUNTER — Ambulatory Visit (INDEPENDENT_AMBULATORY_CARE_PROVIDER_SITE_OTHER): Payer: Medicaid Other | Admitting: Obstetrics and Gynecology

## 2016-09-15 ENCOUNTER — Encounter: Payer: Self-pay | Admitting: Obstetrics and Gynecology

## 2016-09-15 VITALS — BP 108/54 | HR 83 | Ht 66.0 in | Wt 146.0 lb

## 2016-09-15 DIAGNOSIS — R102 Pelvic and perineal pain: Secondary | ICD-10-CM

## 2016-09-15 DIAGNOSIS — N83202 Unspecified ovarian cyst, left side: Secondary | ICD-10-CM

## 2016-09-15 MED ORDER — NORETHINDRONE ACETATE 5 MG PO TABS: 5.0000 mg | ORAL_TABLET | Freq: Every day | ORAL | 11 refills | Status: DC

## 2016-09-15 NOTE — Progress Notes (Signed)
Obstetrics & Gynecology Office Visit   Chief Complaint:  Chief Complaint  Patient presents with  . Ovarian Cyst    Referred by [facility]    History of Present Illness: The patient is a 36 y.o. female presenting for follow up of right complex ovarian cyst noted on CT scan during work up of abdominal pain by her PCP on 07/01/2016.  The cyst in question measured 2.5cm, no presence of ascites, or lymphadenopathy.  This is read as stable in appearance from CT scan in December (images not available for review but CT scan from [facility] 2016 read as normal).  There is not a notable family history of ovarian cancer, uterine cancer, breast cancer, or colon cancer.  Past medical history notable for nephrolithiasis but this was not demonstrated on CT and symptoms have now been present for about 3 months.  Pain is on the contralateral side of the cyst, right lower quadrant, also some pain in the periumbilical area.  Exacerbated by movement.  She is also currently undergoing evaluation by GI for diarrhea.  She is currently taking OCP's, pain worse around time of menses.  Reports dyspareunia.  No fevers, no chills.     Review of Systems: 10 pointe review of systems negative unless otherwise noted in HPI  Past Medical History:  Past Medical History:  Diagnosis Date  . Abnormal mammogram 05/2016  . Anxiety   . Complex cyst of left ovary 07/04/2016  . Kidney stones   . Tobacco abuse 07/04/2016    Past Surgical History:  Past Surgical History:  Procedure Laterality Date  . BREAST SURGERY  05/2016  . CHOLECYSTECTOMY      Gynecologic History: Patient's last menstrual period was 09/06/2016 (exact date).  Obstetric History: G1P1001  Family History:  Family History  Problem Relation Age of Onset  . Lupus Mother   . Arthritis Mother   . Glaucoma Mother   . Vision loss Mother   . Diabetes Mother   . Ovarian cancer Mother 33  . Diabetes Father   . Prostate cancer Father 12  . Diabetes  Maternal Grandmother   . Dementia Maternal Grandmother   . Diabetes Maternal Grandfather   . Pneumonia Maternal Grandfather   . Diabetes Paternal Grandmother   . Diabetes Paternal Grandfather     Social History:  Social History   Social History  . Marital status: Single    Spouse name: N/A  . Number of children: N/A  . Years of education: N/A   Occupational History  . Not on file.   Social History Main Topics  . Smoking status: Current Every Day Smoker  . Smokeless tobacco: Never Used  . Alcohol use No  . Drug use: No  . Sexual activity: Yes    Birth control/ protection: Pill   Other Topics Concern  . Not on file   Social History Narrative  . No narrative on file    Allergies:  Allergies  Allergen Reactions  . Cyclobenzaprine Itching  . Tramadol Hives  . Amoxicillin   . Mobic [Meloxicam]   . Penicillins     Medications: Prior to Admission medications   Medication Sig Start Date End Date Taking? Authorizing Provider  Norgestimate-Ethinyl Estradiol Triphasic 0.18/0.215/0.25 MG-25 MCG tab Take 1 tablet by mouth daily. 06/04/16  Yes [provider]    Physical Exam Vitals:  Vitals:   09/15/16 1451  BP: (!) 108/54  Pulse: 83   Patient's last menstrual period was 09/06/2016 (exact date).  General: NAD HEENT: normocephalic, anicteric Pulmonary: No increased work of breathing Abdomen: NABS, soft, non-tender, non-distended.  Umbilicus without lesions.  No hepatomegaly, splenomegaly or masses palpable. No evidence of hernia  Genitourinary:  External: Normal external female genitalia.  Normal urethral meatus, normal  Bartholin's and Skene's glands.    Vagina: Normal vaginal mucosa, no evidence of prolapse.    Cervix: Grossly normal in appearance, no bleeding  Uterus: Non-enlarged, mobile, normal contour.  No CMT  Adnexa: ovaries non-enlarged, no adnexal masses  Rectal: deferred  Lymphatic: no evidence of inguinal lymphadenopathy Extremities: no  edema, erythema, or tenderness Neurologic: Grossly intact Psychiatric: mood appropriate, affect full  Female chaperone present for pelvic and breast  portions of the physical exam  Assessment: 36 y.o. G1P1001 with complex left ovarian cyst and right lower quadrant pain  Plan: Problem List Items Addressed This Visit    None    Visit Diagnoses    Left ovarian cyst    -  Primary   Relevant Orders   US Transvaginal Non-OB   Pelvic pain in female       Relevant Orders   US Transvaginal Non-OB       1) The incidence and implication of adnexal masses and ovarian cysts were discussed with the patient in detail.  Prior imaging if available was reviewed at today's visit..  The vast majority of these lesions will represent benign or physiologic processes and may well resolve on repeat imaging with expectant management.  We discussed that in a premenopausal patient not on ovulation suppression with via a systemic form hormonal contraception the normal function of the ovary during follicular development is the formation of a dominant follicle or cyst(s) every month.  This is an essential part of normal reproductive physiology.  In some cases these cysts can take on larger dimensions, hemorrhage, or undergo torsion making them symptomatic. Torsion is relatively unlikely for lesions under 5 cm.  Based on initial imaging findings the overall concern for malignancy is deemed low.  We will obtain follow up imagine approximately 6 weeks from the date of the initial imaging study.  Torsion precautions were given.   - start norethindrone, for empiric treatment of endometriosis - TVUS to further evalute cyst, feel likely hemorrhagic cyst, but endometrioma is in the differential  2) No tumor markers indicated  3) A total of 15 minutes were spent in face-to-face contact with the patient during this encounter with over half of that time devoted to counseling and coordination of care.

## 2016-09-24 ENCOUNTER — Encounter: Payer: Self-pay | Admitting: Obstetrics and Gynecology

## 2016-09-24 ENCOUNTER — Ambulatory Visit (INDEPENDENT_AMBULATORY_CARE_PROVIDER_SITE_OTHER): Payer: Medicaid Other

## 2016-09-24 ENCOUNTER — Ambulatory Visit (INDEPENDENT_AMBULATORY_CARE_PROVIDER_SITE_OTHER): Payer: Medicaid Other | Admitting: Obstetrics and Gynecology

## 2016-09-24 VITALS — BP 96/60 | HR 70 | Wt 146.0 lb

## 2016-09-24 DIAGNOSIS — G8929 Other chronic pain: Secondary | ICD-10-CM

## 2016-09-24 DIAGNOSIS — R102 Pelvic and perineal pain: Secondary | ICD-10-CM

## 2016-09-24 DIAGNOSIS — N83202 Unspecified ovarian cyst, left side: Secondary | ICD-10-CM

## 2016-09-24 NOTE — Progress Notes (Signed)
Gynecology Ultrasound Follow Up  Chief Complaint:  Chief Complaint  Patient presents with  . U/S follow up     History of Present Illness: Patient is a 36 y.o. female who presents today for ultrasound evaluation of pelvic pain .  Ultrasound demonstrates the following findgins Adnexa: left ovary with 26x49mm  Uterus: Structurally normal uterus with normal endometrial stripe   Additional: no free fluid  Review of Systems: Review of Systems  Gastrointestinal: Positive for abdominal pain. Negative for constipation and diarrhea.    Past Medical History:  Past Medical History:  Diagnosis Date  . Abnormal mammogram 05/2016  . Anxiety   . Complex cyst of left ovary 07/04/2016  . Kidney stones   . Tobacco abuse 07/04/2016    Past Surgical History:  Past Surgical History:  Procedure Laterality Date  . BREAST SURGERY  05/2016  . CHOLECYSTECTOMY      Gynecologic History:  Patient's last menstrual period was 09/06/2016 (exact date).   Family History:  Family History  Problem Relation Age of Onset  . Lupus Mother   . Arthritis Mother   . Glaucoma Mother   . Vision loss Mother   . Diabetes Mother   . Ovarian cancer Mother 36  . Diabetes Father   . Prostate cancer Father 27  . Diabetes Maternal Grandmother   . Dementia Maternal Grandmother   . Diabetes Maternal Grandfather   . Pneumonia Maternal Grandfather   . Diabetes Paternal Grandmother   . Diabetes Paternal Grandfather     Social History:  Social History   Social History  . Marital status: Single    Spouse name: N/A  . Number of children: N/A  . Years of education: N/A   Occupational History  . Not on file.   Social History Main Topics  . Smoking status: Current Every Day Smoker  . Smokeless tobacco: Never Used  . Alcohol use No  . Drug use: No  . Sexual activity: Yes    Birth control/ protection: Pill   Other Topics Concern  . Not on file   Social History Narrative  . No narrative on file      Allergies:  Allergies  Allergen Reactions  . Cyclobenzaprine Itching  . Tramadol Hives  . Amoxicillin   . Mobic [Meloxicam]   . Penicillins     Medications: Prior to Admission medications   Medication Sig Start Date End Date Taking? Authorizing Provider  norethindrone (AYGESTIN) 5 MG tablet Take 1 tablet (5 mg total) by mouth daily. 09/15/16   Malachy Mood, MD    Physical Exam Vitals: Blood pressure 96/60, pulse 70, weight 146 lb (66.2 kg), last menstrual period 09/06/2016.  General: NAD HEENT: normocephalic, anicteric Pulmonary: No increased work of breathing Extremities: no edema, erythema, or tenderness Neurologic: Grossly intact, normal gait Psychiatric: mood appropriate, affect full   Assessment: 36 y.o. G1P1001 follow up pelvic pain  Plan: Problem List Items Addressed This Visit    None    Visit Diagnoses    Left ovarian cyst    -  Primary   Chronic pelvic pain in female          1) Left ovarian cyst - stable in size, appearance is consistent with possible hemorrhagic cyst or endometrioma. Given lack of resolution and pelvic pain endometriosis remains high on differential - empiric treatment vs diagnostic laparoscopy discussed - patient opts diagnostic scope possible hysterectomy after - A total of 15 minutes were spent in face-to-face contact with the patient  during this encounter with over half of that time devoted to counseling and coordination of care.

## 2016-10-01 ENCOUNTER — Encounter: Payer: Self-pay | Admitting: Obstetrics and Gynecology

## 2016-10-01 ENCOUNTER — Ambulatory Visit (INDEPENDENT_AMBULATORY_CARE_PROVIDER_SITE_OTHER): Payer: Medicaid Other | Admitting: Obstetrics and Gynecology

## 2016-10-01 VITALS — BP 100/62 | HR 91 | Ht 66.0 in | Wt 148.0 lb

## 2016-10-01 DIAGNOSIS — N83202 Unspecified ovarian cyst, left side: Secondary | ICD-10-CM

## 2016-10-01 DIAGNOSIS — R102 Pelvic and perineal pain: Secondary | ICD-10-CM

## 2016-10-01 NOTE — Progress Notes (Signed)
[name]    Chief Complaint: Scheduled Surgery   History of Present Illness: Patient is a 36 y.o. G1P1001 presenting for scheduled diagnostic laparoscopy, left ovarian cystectomy, for the treatment or further evaluation of pelvic pain and left ovarian cyst. Appearance of cyst consistent with possible endometrioma.  Prior Treatments prior to proceeding with surgery include: norethindrone  Preoperative Ultrasound: 09/24/16 2cm left ovarian cyst consistent with endometrioma   Review of Systems:10 point review of systems  Past Medical History:  Past Medical History:  Diagnosis Date  . Abnormal mammogram 05/2016  . Anxiety   . Complex cyst of left ovary 07/04/2016  . Kidney stones   . Tobacco abuse 07/04/2016    Past Surgical History:  Past Surgical History:  Procedure Laterality Date  . BREAST SURGERY  05/2016  . CHOLECYSTECTOMY      Family History:  Family History  Problem Relation Age of Onset  . Lupus Mother   . Arthritis Mother   . Glaucoma Mother   . Vision loss Mother   . Diabetes Mother   . Ovarian cancer Mother 26  . Diabetes Father   . Prostate cancer Father 19  . Diabetes Maternal Grandmother   . Dementia Maternal Grandmother   . Diabetes Maternal Grandfather   . Pneumonia Maternal Grandfather   . Diabetes Paternal Grandmother   . Diabetes Paternal Grandfather     Social History:  Social History   Social History  . Marital status: Single    Spouse name: N/A  . Number of children: N/A  . Years of education: N/A   Occupational History  . Not on file.   Social History Main Topics  . Smoking status: Current Every Day Smoker  . Smokeless tobacco: Never Used  . Alcohol use No  . Drug use: No  . Sexual activity: Yes    Birth control/ protection: Pill   Other Topics Concern  . Not on file   Social History Narrative  . No narrative on file    Allergies:  Allergies  Allergen Reactions  . Cyclobenzaprine  Itching  . Mobic [Meloxicam] Anaphylaxis  . Tramadol Hives  . Amoxicillin Hives and Itching  . Penicillins Hives, Itching and Rash    Has patient had a PCN reaction causing immediate rash, facial/tongue/throat swelling, SOB or lightheadedness with hypotension: Yes Has patient had a PCN reaction causing severe rash involving mucus membranes or skin necrosis: No Has patient had a PCN reaction that required hospitalization: No Has patient had a PCN reaction occurring within the last 10 years: No If all of the above answers are "NO", then may proceed with Cephalosporin use.     Medications: Prior to Admission medications   Medication Sig Start Date End Date Taking? Authorizing Provider  ibuprofen (ADVIL,MOTRIN) 800 MG tablet Take 800 mg by mouth 2 (two) times daily as needed for moderate pain.   Yes [provider]  norethindrone (AYGESTIN) 5 MG tablet Take 1 tablet (5 mg total) by mouth daily. 09/15/16  Yes Malachy Mood, MD    Physical Exam Vitals: Blood pressure 100/62, pulse 91, height 5\' 6"  (1.676 m), weight 148 lb (67.1 kg), last menstrual period 09/06/2016. General: NAD HEENT: normocephalic, anicteric Pulmonary: No increased work of breathing Cardiovascular: RRR, distal pulses 2+ Abdomen: soft, non-tender, non-distended Genitourinary: deferred Extremities: no edema, erythema, or tenderness Neurologic: Grossly intact Psychiatric: mood appropriate, affect full  Imaging No results found.  Assessment: 36 y.o. G1P1001 presenting for scheduled diagnostic  laparoscopy and left ovarian cystectomy  Plan: 1) I have had a careful discussion with this patient about all the options available and the risk/benefits of each. I have fully informed this patient that a laparoscopy may subject her to a variety of discomforts and risks: She understands that most patients have surgery with little difficulty, but problems can happen ranging from minor to fatal. These include nausea,  vomiting, pain, bleeding, infection, poor healing, hernia, or formation of adhesions. Unexpected reactions may occur from any drug or anesthetic given. Unintended injury may occur to other pelvic or abdominal structures such as Fallopian tubes, ovaries, bladder, ureter (tube from kidney to bladder), or bowel. Nerves going from the pelvis to the legs may be injured. Any such injury may require immediate or later additional surgery to correct the problem. Excessive blood loss requiring transfusion is very unlikely but possible. Dangerous blood clots may form in the legs or lungs. Physical and sexual activity will be restricted in varying degrees for an indeterminate period of time but most often 2-4 weeks. She understands that the plan is to do this laparoscopically, however, there is a chance that this will need to be performed via a larger incision. Finally, she understands that it is impossible to list every possible undesirable effect and that the condition for which surgery is done is not always cured or significantly improved, and in rare cases may be even worsen. Ample time was given to answer all questions.  2) Routine postoperative instructions were reviewed with the patient and her family in detail today including the expected length of recovery and likely postoperative course.  The patient concurred with the proposed plan, giving informed written consent for the surgery today.  Patient instructed on the importance of being NPO after midnight prior to her procedure.  If warranted preoperative prophylactic antibiotics and SCDs ordered on call to the OR to meet SCIP guidelines and adhere to recommendation laid forth in [place] Number 104 May 2009  "Antibiotic Prophylaxis for Gynecologic Procedures".

## 2016-10-02 ENCOUNTER — Encounter
Admission: RE | Admit: 2016-10-02 | Discharge: 2016-10-02 | Disposition: A | Discharge status: Home / Self Care | Payer: Medicaid Other | Source: Ambulatory Visit | Attending: Obstetrics and Gynecology | Admitting: Obstetrics and Gynecology

## 2016-10-02 HISTORY — DX: Other specified postprocedural states: R11.2

## 2016-10-02 HISTORY — DX: Personal history of urinary calculi: Z87.442

## 2016-10-02 HISTORY — DX: Nausea with vomiting, unspecified: R11.2

## 2016-10-02 HISTORY — DX: Other specified postprocedural states: Z98.890

## 2016-10-02 NOTE — Patient Instructions (Signed)
Your procedure is scheduled on: 10/09/16 Report to [facility]. 2ND FLOOR MEDICAL MALL ENTRANCE. To find out your arrival time please call [phone] between 1PM - 3PM on wednesday. 10/08/16  Remember: Instructions that are not followed completely may result in serious medical risk, up to and including death, or upon the discretion of your surgeon and anesthesiologist your surgery may need to be rescheduled.    __X__ 1. Do not eat food or drink liquids after midnight. No gum chewing or hard candies.     __X__ 2. No Alcohol for 24 hours before or after surgery.   ____ 3. Bring all medications with you on the day of surgery if instructed.    __X__ 4. Notify your doctor if there is any change in your medical condition     (cold, fever, infections).             _____5. No smoking within 24 hours of your surgery.     Do not wear jewelry, make-up, hairpins, clips or nail polish.  Do not wear lotions, powders, or perfumes.   Do not shave 48 hours prior to surgery. Men may shave face and neck.  Do not bring valuables to the hospital.    [facility] is not responsible for any belongings or valuables.               Contacts, dentures or bridgework may not be worn into surgery.  Leave your suitcase in the car. After surgery it may be brought to your room.  For patients admitted to the hospital, discharge time is determined by your                treatment team.   Patients discharged the day of surgery will not be allowed to drive home.   Please read over the following fact sheets that you were given:    ____ Take these medicines the morning of surgery with A SIP OF WATER:    1.   2.   3.   4.  5.  6.  ____ Fleet Enema (as directed)   ____ Use CHG Soap as directed  ____ Use inhalers on the day of surgery  ____ Stop metformin 2 days prior to surgery    ____ Take 1/2 of usual insulin dose the night before surgery and none on the morning of surgery.   ____ Stop  Coumadin/Plavix/aspirin on   __X__ Stop Anti-inflammatories such as Advil, Aleve, Ibuprofen, Motrin, Naproxen, Naprosyn, Goodies,powder, or aspirin products.  OK to take Tylenol.   ____ Stop supplements until after surgery.    ____ Bring C-Pap to the hospital.

## 2016-10-09 ENCOUNTER — Ambulatory Visit
Admission: RE | Admit: 2016-10-09 | Discharge: 2016-10-09 | Disposition: A | Discharge status: Home / Self Care | Payer: Medicaid Other | Source: Ambulatory Visit | Attending: Obstetrics and Gynecology | Admitting: Obstetrics and Gynecology

## 2016-10-09 ENCOUNTER — Ambulatory Visit: Payer: Medicaid Other | Admitting: Anesthesiology

## 2016-10-09 ENCOUNTER — Encounter
Admission: RE | Disposition: A | Discharge status: Home / Self Care | Payer: Self-pay | Source: Ambulatory Visit | Attending: Obstetrics and Gynecology

## 2016-10-09 ENCOUNTER — Encounter: Payer: Self-pay | Admitting: Anesthesiology

## 2016-10-09 DIAGNOSIS — N801 Endometriosis of ovary: Secondary | ICD-10-CM | POA: Diagnosis not present

## 2016-10-09 DIAGNOSIS — G8929 Other chronic pain: Secondary | ICD-10-CM | POA: Insufficient documentation

## 2016-10-09 DIAGNOSIS — N83202 Unspecified ovarian cyst, left side: Secondary | ICD-10-CM | POA: Insufficient documentation

## 2016-10-09 DIAGNOSIS — F17210 Nicotine dependence, cigarettes, uncomplicated: Secondary | ICD-10-CM | POA: Diagnosis not present

## 2016-10-09 DIAGNOSIS — Z9889 Other specified postprocedural states: Secondary | ICD-10-CM

## 2016-10-09 HISTORY — PX: LAPAROSCOPIC OVARIAN CYSTECTOMY: SHX6248

## 2016-10-09 LAB — TYPE AND SCREEN
ABO/RH(D): A NEG
ANTIBODY SCREEN: NEGATIVE

## 2016-10-09 LAB — ABO/RH: ABO/RH(D): A NEG

## 2016-10-09 LAB — POCT PREGNANCY, URINE: Preg Test, Ur: NEGATIVE

## 2016-10-09 SURGERY — EXCISION, CYST, OVARY, LAPAROSCOPIC
Anesthesia: General | Laterality: Left

## 2016-10-09 MED ORDER — ROCURONIUM BROMIDE 100 MG/10ML IV SOLN
INTRAVENOUS | Status: DC | PRN
  Administered 2016-10-09: 40 mg via INTRAVENOUS

## 2016-10-09 MED ORDER — PROMETHAZINE HCL 25 MG/ML IJ SOLN
6.2500 mg | INTRAMUSCULAR | Status: AC
  Administered 2016-10-09: 6.25 mg via INTRAVENOUS

## 2016-10-09 MED ORDER — SUCCINYLCHOLINE CHLORIDE 20 MG/ML IJ SOLN
INTRAMUSCULAR | Status: AC
  Filled 2016-10-09: qty 1

## 2016-10-09 MED ORDER — OXYCODONE-ACETAMINOPHEN 5-325 MG PO TABS: 1.0000 | ORAL_TABLET | Freq: Four times a day (QID) | ORAL | 0 refills | Status: DC | PRN

## 2016-10-09 MED ORDER — SODIUM CHLORIDE 0.9 % IJ SOLN
INTRAMUSCULAR | Status: AC
  Filled 2016-10-09: qty 20

## 2016-10-09 MED ORDER — PROMETHAZINE HCL 25 MG/ML IJ SOLN
INTRAMUSCULAR | Status: AC
  Filled 2016-10-09: qty 1

## 2016-10-09 MED ORDER — SUGAMMADEX SODIUM 500 MG/5ML IV SOLN
INTRAVENOUS | Status: DC | PRN
  Administered 2016-10-09: 200 mg via INTRAVENOUS

## 2016-10-09 MED ORDER — HYDROMORPHONE HCL 1 MG/ML IJ SOLN
INTRAMUSCULAR | Status: DC | PRN
  Administered 2016-10-09 (×2): 0.5 mg via INTRAVENOUS

## 2016-10-09 MED ORDER — BUPIVACAINE HCL (PF) 0.5 % IJ SOLN
INTRAMUSCULAR | Status: AC
  Filled 2016-10-09: qty 30

## 2016-10-09 MED ORDER — HYDROMORPHONE HCL 1 MG/ML IJ SOLN
INTRAMUSCULAR | Status: AC
  Filled 2016-10-09: qty 1

## 2016-10-09 MED ORDER — ONDANSETRON HCL 4 MG/2ML IJ SOLN: 4.0000 mg | Freq: Once | INTRAMUSCULAR | Status: DC | PRN

## 2016-10-09 MED ORDER — FENTANYL CITRATE (PF) 100 MCG/2ML IJ SOLN
INTRAMUSCULAR | Status: DC | PRN
  Administered 2016-10-09 (×2): 50 ug via INTRAVENOUS

## 2016-10-09 MED ORDER — MIDAZOLAM HCL 2 MG/2ML IJ SOLN
INTRAMUSCULAR | Status: AC
  Filled 2016-10-09: qty 2

## 2016-10-09 MED ORDER — ONDANSETRON 4 MG PO TBDP: 4.0000 mg | ORAL_TABLET | Freq: Four times a day (QID) | ORAL | 0 refills | Status: DC | PRN

## 2016-10-09 MED ORDER — PHENYLEPHRINE HCL 10 MG/ML IJ SOLN
INTRAMUSCULAR | Status: DC | PRN
  Administered 2016-10-09 (×2): 150 ug via INTRAVENOUS

## 2016-10-09 MED ORDER — SUGAMMADEX SODIUM 200 MG/2ML IV SOLN
INTRAVENOUS | Status: AC
  Filled 2016-10-09: qty 2

## 2016-10-09 MED ORDER — BUPIVACAINE HCL 0.5 % IJ SOLN
INTRAMUSCULAR | Status: DC | PRN
  Administered 2016-10-09: 16 mL

## 2016-10-09 MED ORDER — SCOPOLAMINE 1 MG/3DAYS TD PT72
1.0000 | MEDICATED_PATCH | TRANSDERMAL | Status: DC
  Administered 2016-10-09: 1.5 mg via TRANSDERMAL

## 2016-10-09 MED ORDER — FAMOTIDINE 20 MG PO TABS
ORAL_TABLET | ORAL | Status: AC
  Filled 2016-10-09: qty 1

## 2016-10-09 MED ORDER — ONDANSETRON HCL 4 MG/2ML IJ SOLN
INTRAMUSCULAR | Status: DC | PRN
  Administered 2016-10-09: 4 mg via INTRAVENOUS

## 2016-10-09 MED ORDER — PROPOFOL 10 MG/ML IV BOLUS
INTRAVENOUS | Status: DC | PRN
  Administered 2016-10-09: 150 mg via INTRAVENOUS

## 2016-10-09 MED ORDER — PROMETHAZINE HCL 25 MG/ML IJ SOLN
6.2500 mg | Freq: Once | INTRAMUSCULAR | Status: AC
  Administered 2016-10-09: 6.25 mg via INTRAVENOUS

## 2016-10-09 MED ORDER — LACTATED RINGERS IV SOLN
INTRAVENOUS | Status: DC
  Administered 2016-10-09 (×2): via INTRAVENOUS

## 2016-10-09 MED ORDER — FENTANYL CITRATE (PF) 100 MCG/2ML IJ SOLN
INTRAMUSCULAR | Status: AC
  Filled 2016-10-09: qty 2

## 2016-10-09 MED ORDER — FAMOTIDINE 20 MG PO TABS
20.0000 mg | ORAL_TABLET | Freq: Once | ORAL | Status: AC
  Administered 2016-10-09: 20 mg via ORAL

## 2016-10-09 MED ORDER — SCOPOLAMINE 1 MG/3DAYS TD PT72
MEDICATED_PATCH | TRANSDERMAL | Status: AC
  Filled 2016-10-09: qty 1

## 2016-10-09 MED ORDER — FENTANYL CITRATE (PF) 100 MCG/2ML IJ SOLN
25.0000 ug | INTRAMUSCULAR | Status: DC | PRN
  Administered 2016-10-09 (×4): 25 ug via INTRAVENOUS

## 2016-10-09 MED ORDER — LIDOCAINE HCL (CARDIAC) 20 MG/ML IV SOLN
INTRAVENOUS | Status: DC | PRN
  Administered 2016-10-09: 100 mg via INTRAVENOUS

## 2016-10-09 MED ORDER — PROPOFOL 10 MG/ML IV BOLUS
INTRAVENOUS | Status: AC
Start: 2016-10-09 — End: ?
  Filled 2016-10-09: qty 20

## 2016-10-09 MED ORDER — IBUPROFEN 800 MG PO TABS: 800.0000 mg | ORAL_TABLET | Freq: Three times a day (TID) | ORAL | 0 refills | Status: DC | PRN

## 2016-10-09 SURGICAL SUPPLY — 40 items
ADH SKN CLS APL DERMABOND .7 (GAUZE/BANDAGES/DRESSINGS) ×1
ANCHOR TIS RET SYS 235ML (MISCELLANEOUS) IMPLANT
APL SRG 38 LTWT LNG FL B (MISCELLANEOUS) ×1
APPLICATOR ARISTA FLEXITIP XL (MISCELLANEOUS) ×2 IMPLANT
BAG TISS RTRVL C235 10X14 (MISCELLANEOUS)
BAG URO DRAIN 2000ML W/SPOUT (MISCELLANEOUS) ×3 IMPLANT
BLADE SURG SZ11 CARB STEEL (BLADE) ×3 IMPLANT
CANISTER SUCT 1200ML W/VALVE (MISCELLANEOUS) ×3 IMPLANT
CATH FOLEY 2WAY  5CC 16FR (CATHETERS) ×2
CATH FOLEY 2WAY 5CC 16FR (CATHETERS) ×1
CATH ROBINSON RED A/P 16FR (CATHETERS) ×1 IMPLANT
CATH URTH 16FR FL 2W BLN LF (CATHETERS) ×1 IMPLANT
CHLORAPREP W/TINT 26ML (MISCELLANEOUS) ×3 IMPLANT
DERMABOND ADVANCED (GAUZE/BANDAGES/DRESSINGS) ×2
DERMABOND ADVANCED .7 DNX12 (GAUZE/BANDAGES/DRESSINGS) ×1 IMPLANT
GLOVE BIO SURGEON STRL SZ7 (GLOVE) ×9 IMPLANT
GLOVE INDICATOR 7.5 STRL GRN (GLOVE) ×9 IMPLANT
GOWN STRL REUS W/ TWL LRG LVL3 (GOWN DISPOSABLE) ×2 IMPLANT
GOWN STRL REUS W/ TWL XL LVL3 (GOWN DISPOSABLE) IMPLANT
GOWN STRL REUS W/TWL LRG LVL3 (GOWN DISPOSABLE) ×6
GOWN STRL REUS W/TWL XL LVL3 (GOWN DISPOSABLE)
GRASPER SUT TROCAR 14GX15 (MISCELLANEOUS) IMPLANT
HEMOSTAT ARISTA ABSORB 1G (MISCELLANEOUS) ×2 IMPLANT
IRRIGATION STRYKERFLOW (MISCELLANEOUS) IMPLANT
IRRIGATOR STRYKERFLOW (MISCELLANEOUS) ×3
IV LACTATED RINGERS 1000ML (IV SOLUTION) ×3 IMPLANT
KIT RM TURNOVER CYSTO AR (KITS) ×3 IMPLANT
LABEL OR SOLS (LABEL) ×3 IMPLANT
NS IRRIG 500ML POUR BTL (IV SOLUTION) ×3 IMPLANT
PACK GYN LAPAROSCOPIC (MISCELLANEOUS) ×3 IMPLANT
PAD OB MATERNITY 4.3X12.25 (PERSONAL CARE ITEMS) ×3 IMPLANT
PAD PREP 24X41 OB/GYN DISP (PERSONAL CARE ITEMS) ×3 IMPLANT
SCISSORS METZENBAUM CVD 33 (INSTRUMENTS) IMPLANT
SHEARS HARMONIC ACE PLUS 36CM (ENDOMECHANICALS) ×3 IMPLANT
SLEEVE ENDOPATH XCEL 5M (ENDOMECHANICALS) ×3 IMPLANT
SUT MNCRL AB 4-0 PS2 18 (SUTURE) ×3 IMPLANT
SUT VIC AB 2-0 UR6 27 (SUTURE) ×3 IMPLANT
TROCAR ENDO BLADELESS 11MM (ENDOMECHANICALS) ×3 IMPLANT
TROCAR XCEL NON-BLD 5MMX100MML (ENDOMECHANICALS) ×3 IMPLANT
TUBING INSUFFLATOR HI FLOW (MISCELLANEOUS) ×3 IMPLANT

## 2016-10-09 NOTE — Anesthesia Preprocedure Evaluation (Signed)
Anesthesia Evaluation  Patient identified by MRN, date of birth, ID band Patient awake    Reviewed: Allergy & Precautions, NPO status , Patient's Chart, lab work & pertinent test results  History of Anesthesia Complications (+) PONV and history of anesthetic complications  Airway Mallampati: II       Dental   Pulmonary Current Smoker,    Pulmonary exam normal        Cardiovascular negative cardio ROS Normal cardiovascular exam     Neuro/Psych PSYCHIATRIC DISORDERS Anxiety negative neurological ROS     GI/Hepatic negative GI ROS, Neg liver ROS,   Endo/Other  negative endocrine ROS  Renal/GU stones  Female GU complaint     Musculoskeletal negative musculoskeletal ROS (+)   Abdominal Normal abdominal exam  (+)   Peds negative pediatric ROS (+)  Hematology negative hematology ROS (+)   Anesthesia Other Findings Past Medical History: 05/2016: Abnormal mammogram No date: Anxiety 07/04/2016: Complex cyst of left ovary No date: History of kidney stones No date: PONV (postoperative nausea and vomiting) 07/04/2016: Tobacco abuse  Reproductive/Obstetrics                             Anesthesia Physical Anesthesia Plan  ASA: II  Anesthesia Plan: General   Post-op Pain Management:    Induction: Intravenous  Airway Management Planned: Oral ETT  Additional Equipment:   Intra-op Plan:   Post-operative Plan: Extubation in OR  Informed Consent: I have reviewed the patients History and Physical, chart, labs and discussed the procedure including the risks, benefits and alternatives for the proposed anesthesia with the patient or authorized representative who has indicated his/her understanding and acceptance.     Plan Discussed with: CRNA and Surgeon  Anesthesia Plan Comments:         Anesthesia Quick Evaluation

## 2016-10-09 NOTE — Anesthesia Procedure Notes (Signed)
Procedure Name: Intubation Date/Time: 10/09/2016 1:10 PM Performed by: [facility] Pre-anesthesia Checklist: Patient identified, Patient being monitored, Timeout performed, Emergency Drugs available and Suction available Patient Re-evaluated:Patient Re-evaluated prior to inductionOxygen Delivery Method: Circle system utilized Preoxygenation: Pre-oxygenation with 100% oxygen Intubation Type: IV induction Ventilation: Mask ventilation without difficulty Laryngoscope Size: Mac and 3 Grade View: Grade I Tube type: Oral Tube size: 7.0 mm Number of attempts: 1 Airway Equipment and Method: Stylet Placement Confirmation: ETT inserted through vocal cords under direct vision,  positive ETCO2 and breath sounds checked- equal and bilateral Secured at: 21 cm Tube secured with: Tape Dental Injury: Teeth and Oropharynx as per pre-operative assessment

## 2016-10-09 NOTE — Anesthesia Post-op Follow-up Note (Cosign Needed)
Anesthesia QCDR form completed.        

## 2016-10-09 NOTE — H&P (Signed)
Date of Initial H&P: 10/01/2016  History reviewed, patient examined, no change in status, stable for surgery.

## 2016-10-09 NOTE — Transfer of Care (Signed)
Immediate Anesthesia Transfer of Care Note  Patient: Taylor Schwartz  Procedure(s) Performed: Procedure(s): LAPAROSCOPIC OVARIAN CYSTECTOMY (Left)  Patient Location: PACU  Anesthesia Type:General  Level of Consciousness: sedated  Airway & Oxygen Therapy: Patient Spontanous Breathing  Post-op Assessment: Report given to RN and Post -op Vital signs reviewed and stable  Post vital signs: Reviewed and stable  Last Vitals:  Vitals:   10/09/16 1123  BP: 111/75  Pulse: 78  Resp: 18  Temp: 37.4 C    Last Pain: There were no vitals filed for this visit.       Complications: No apparent anesthesia complications

## 2016-10-09 NOTE — Anesthesia Postprocedure Evaluation (Signed)
Anesthesia Post Note  Patient: ALLEXUS OVENS  Procedure(s) Performed: Procedure(s) (LRB): LAPAROSCOPIC OVARIAN CYSTECTOMY (Left)  Patient location during evaluation: PACU Anesthesia Type: General Level of consciousness: awake and alert and oriented Pain management: pain level controlled Vital Signs Assessment: post-procedure vital signs reviewed and stable Respiratory status: spontaneous breathing Cardiovascular status: blood pressure returned to baseline Anesthetic complications: no     Last Vitals:  Vitals:   10/09/16 1430 10/09/16 1446  BP: 109/68 (!) 102/57  Pulse: 91 72  Resp: 16 13  Temp: 36.6 C     Last Pain:  Vitals:   10/09/16 1446  PainSc: Asleep                 Dayana Dalporto

## 2016-10-09 NOTE — Progress Notes (Signed)
Assisted up to the bedside commode, urinated; peri pad changed (small amount of bleeding). Stated that nausea was subsiding. Drinking sips of ginger ale.

## 2016-10-09 NOTE — Progress Notes (Signed)
When arrived to post op, patient very nauseated with severe pain when raised up in bed. Bed repositioned lower and allowed to rest in bed for another 30 minutes. Had a few ice chips while lying in the bed. After the 30 minutes, was assisted to the recliner chair. Became nauseated and had small 60 ml emesis. Dr. Marcello Moores (anesthesia) notified of the continued nausea. Order for phenergan obtained and given.

## 2016-10-09 NOTE — Discharge Instructions (Addendum)
Ovarian Cystectomy, Care After Refer to this sheet in the next few weeks. These instructions provide you with information on caring for yourself after your procedure. Your health care provider may also give you more specific instructions. Your treatment has been planned according to current medical practices, but problems sometimes occur. Call your health care provider if you have any problems or questions after your procedure. What can I expect after the procedure? After your procedure, it is typical to have the following:  Pain in your abdomen, especially at the incision sites. You will be given pain medicines to control the pain.  Tiredness. This is a normal part of the recovery process. Your energy level will return to normal over the next several weeks.  Constipation.  Follow these instructions at home:  Only take over-the-counter or prescription medicines as directed by your health care provider. Avoid taking aspirin because it can cause bleeding.  Follow your health care provider's instructions for when to resume your regular diet, exercise, and activities.  Take rest breaks during the day as needed.  Do not douche or have sexual intercourse until you have permission from your health care provider.  Remove or change any bandages (dressings) as directed by your health care provider.  Do not drive until your health care provider approves.  Take showers instead of baths until your health care provider tells you otherwise.  If you become constipated, you may: ? Use a mild laxative if your health care provider approves. ? Add more fruit and bran to your diet. ? Drink more fluids.  Take your temperature twice a day and record it.  Do not drink alcohol while taking pain medicine.  Try to have someone home with you for the first 1-2 weeks to help with your household activities.  Follow up with your health care provider as directed. Contact a health care provider if:  You have  a fever.  You feel sick to your stomach (nauseous) and throw up (vomit).  You have redness, swelling, or leakage of fluid at the incision site.  You have pain when you urinate or have blood in your urine.  You have a rash on your body.  You have pain or redness where the IV tube was inserted.  You have pain that is not relieved with medicine. Get help right away if:  You have chest pain or shortness of breath.  You feel dizzy or lightheaded.  You have increasing abdominal pain that is not relieved with medicines.  You have pain, swelling, or redness in your leg.  You see a yellowish white fluid (pus) coming from the incision.  Your incision is opening (edges not staying together). This information is not intended to replace advice given to you by your health care provider. Make sure you discuss any questions you have with your health care provider. Document Released: 02/16/2013 Document Revised: 10/04/2015 Document Reviewed: 12/08/2012 Elsevier Interactive Patient Education  2017 [place]   1) The drugs that you were given will stay in your system until tomorrow so for the next 24 hours you should not:  A) Drive an automobile B) Make any legal decisions C) Drink any alcoholic beverage   2) You may resume regular meals tomorrow.  Today it is better to start with liquids and gradually work up to solid foods.  You may eat anything you prefer, but it is better to start with liquids, then soup and crackers, and gradually work up  to solid foods.   3) Please notify your doctor immediately if you have any unusual bleeding, trouble breathing, redness and pain at the surgery site, drainage, fever, or pain not relieved by medication.

## 2016-10-09 NOTE — Op Note (Signed)
Preoperative Diagnosis: 61) 36 y.o. year old G1P1001 with chronic pelvic pain 2) Left ovarian cyst   Postoperative Diagnosis: 65) 36 y.o. year old G1P1001 with chronic pelvic pain 2) Endometrioma  Operation Performed: Laparoscopic left ovarian cystectomy  Indication: Chronic pelvic pain and imaging showing a small 2cm endometrioma   Surgeon: Malachy Mood, MD  Anesthesia: General  Preoperative Antibiotics: none  Estimated Blood Loss: 19mL  IV Fluids: 1L  Urine Output:: 373mL  Drains or Tubes: none  Implants: none  Specimens Removed: left ovarian cyst wall  Complications: none  Intraoperative Findings: Scarring of the left round ligament anteriorly, scarring of the left ovary to the left pelvic sidewall.  Small endometrioma and small adjacent simple cyst  Patient Condition: stable  Procedure in Detail:  Patient was taken to the operating room where she was administered general anesthesia.  She was positioned in the dorsal lithotomy position utilizing Allen stirups, prepped and draped in the usual sterile fashion.  Prior to proceeding with procedure a time out was performed.  Attention was turned to the patient's pelvis.  A red rubber catheter was used to empty the patient's bladder. Sterile speculum was placed, cervix visualized, grasped with single tooth tenaculum before placing a Hulka tenaculum to allow uterine manipulation.  The single tooth tenaculum and speculum were then removed.   Attention was turned to the patient's abdomen.  The umbilicus was infiltrated with 1% Sensorcaine, before making a stab incision using an 11 blade scalpel.  A 67mm Excel trocar was then used to gain direct entry into the peritoneal cavity utilizing the camera to visualize progress of the trocar during placement.  Once peritoneal entry had been achieved, insufflation was started and pneumoperitoneum established at a pressure of 52mmHg.    A 37m Excel trocar was placed in the left  lower quadrant  under direct visualization, followed by a second 56mm trocar in the left upper quadrant..    General inspection of the abdomen revealed the above noted findings.   The ovarian cortex overlying the left ovarian cyst was incised using a 73mm harmonic scalpel.  The cyst ruptured during dissection of the cortex with thick brown fluid noted.  The cyst wall was teased out.  Pressure was dropped to 4mmHg and pedicles were noted to be hemostatic.  1g of Arista was applied to all pedicles.  Pneumoperitoneum was evacuated.  The trocars were removed.  All trocar sites were then dressed with surgical skin glue.  The Hulka tenaculum was removed.  Sponge needle and instrument counts were correct time two.  The patient tolerated the procedure well and was taken to the recovery room in stable condition.

## 2016-10-13 LAB — SURGICAL PATHOLOGY

## 2016-10-14 ENCOUNTER — Encounter: Payer: Self-pay | Admitting: Obstetrics and Gynecology

## 2016-10-16 ENCOUNTER — Encounter: Payer: Self-pay | Admitting: Obstetrics and Gynecology

## 2016-10-16 ENCOUNTER — Ambulatory Visit (INDEPENDENT_AMBULATORY_CARE_PROVIDER_SITE_OTHER): Payer: Medicaid Other | Admitting: Obstetrics and Gynecology

## 2016-10-16 VITALS — BP 100/56 | HR 80 | Wt 144.0 lb

## 2016-10-16 DIAGNOSIS — N809 Endometriosis, unspecified: Secondary | ICD-10-CM

## 2016-10-16 DIAGNOSIS — Z4889 Encounter for other specified surgical aftercare: Secondary | ICD-10-CM

## 2016-10-16 NOTE — Progress Notes (Signed)
      Postoperative Follow-up Patient presents post op from diagnostic laparoscopy, left cystectomy of endometrioma 1weeks ago for pelvic pain.  Subjective: Patient reports marked improvement in her preop symptoms. Eating a regular diet without difficulty. The patient is not having any pain.  Activity: normal activities of daily living.  Objective: Vitals:   10/16/16 1005  BP: (!) 100/56  Pulse: 80   Gen: NAD Abdomen: soft, non-tender, non-distended, some mild ecchymosis LLQ port site  Assessment: 36 y.o. s/p diagnostic laparoscopy, left ovarian cystectomy progressing well  Plan: Patient has done well after surgery with no apparent complications.  I have discussed the post-operative course to date, and the expected progress moving forward.  The patient understands what complications to be concerned about.  I will see the patient in routine follow up, or sooner if needed.    Activity plan: No restriction. - continue norethindrone - assess full response at 3 months, discussed Lupron, titration gabapentin Malachy Mood 10/16/2016, 10:26 AM

## 2016-10-17 ENCOUNTER — Telehealth: Payer: Self-pay

## 2016-10-17 NOTE — Telephone Encounter (Signed)
Pt called stating she was seen yesterday by AMS for postop. She mentioned she had been running a low grade fever (99-100). She took a shower and has noticed a little yellowish-green oozing,she applied peroxide in which of course it bubbled. No redness. Still running a low grade fever. Does she need an abx?  Pt aware AMS is on call and I would send this to him, in the meantime she should continue to use peroxide if needed and look out for increase redness/sorness/fever. Advised no openings today w/provider here but she could go to urgent care if she thought she needed to. Pt states she will wait to here back from either myself or AMS. CB# [place]

## 2016-10-17 NOTE — Telephone Encounter (Signed)
Pt aware of AMS message 

## 2016-10-17 NOTE — Telephone Encounter (Signed)
No there was no evidence of infection in any of her prot sites.  This is a relatively low risk procedure for infection.  My cut off for a fever is anything above 100.63F

## 2016-11-21 ENCOUNTER — Ambulatory Visit: Payer: Medicaid Other | Admitting: Obstetrics and Gynecology

## 2016-12-09 ENCOUNTER — Encounter: Payer: Self-pay | Admitting: Obstetrics and Gynecology

## 2016-12-09 ENCOUNTER — Ambulatory Visit (INDEPENDENT_AMBULATORY_CARE_PROVIDER_SITE_OTHER): Payer: Medicaid Other | Admitting: Obstetrics and Gynecology

## 2016-12-09 VITALS — BP 102/56 | HR 85 | Wt 144.0 lb

## 2016-12-09 DIAGNOSIS — Z4889 Encounter for other specified surgical aftercare: Secondary | ICD-10-CM

## 2016-12-09 NOTE — Progress Notes (Signed)
      Postoperative Follow-up Patient presents post op from laparoscopic left ovarian cystectomy for endometrioma 6weeks ago for endometrioma.  Subjective: Patient reports marked improvement in her preop symptoms. Eating a regular diet without difficulty. The patient is not having any pain.  Activity: normal activities of daily living.  Still having some irregular vaginal bleeding on norethindrone  Objective: Vitals:   12/09/16 0956  BP: (!) 102/56  Pulse: 85   Gen: NAD Abdomen: Trocar sites well healed, non-tender, non-distended GU: Normal external female genitalia, normal cervix, normal uterus, no adnexal masses, non-tender Ext: no edema  Assessment: 36 y.o. s/p laparoscopic right ovarian cystectomy for endometrioma stable  Plan: Patient has done well after surgery with no apparent complications.  I have discussed the post-operative course to date, and the expected progress moving forward.  The patient understands what complications to be concerned about.  I will see the patient in routine follow up, or sooner if needed.    Activity plan: No restriction.  Continue norethindrone still having menses discussed possible trial of continuous OCP's if continues.  Declines depo provera or IUD.  Also discussed stepping up to depo leupron already on gapantin but not taking consitently for scoliosis  Malachy Mood 12/09/2016, 9:16 PM

## 2016-12-30 ENCOUNTER — Telehealth: Payer: Self-pay

## 2016-12-30 NOTE — Telephone Encounter (Signed)
Pt called triage line stating she has been having her cycles and they are lasting 10-15 days. She states AMS told her he could give her a pill to help with this.   Pt aware, via MyChart, AMS is out of the office today and will return on Friday. Message sent to him for review.

## 2016-12-31 ENCOUNTER — Other Ambulatory Visit: Payer: Self-pay | Admitting: Obstetrics and Gynecology

## 2016-12-31 MED ORDER — NORGESTIMATE-ETH ESTRADIOL 0.25-35 MG-MCG PO TABS: 1.0000 | ORAL_TABLET | Freq: Every day | ORAL | 3 refills | Status: DC

## 2016-12-31 NOTE — Telephone Encounter (Signed)
Pt aware of Rx being sent to pharmacy

## 2016-12-31 NOTE — Telephone Encounter (Signed)
I put in an rx for continuous OCP

## 2017-04-10 ENCOUNTER — Encounter: Payer: Self-pay | Admitting: Obstetrics and Gynecology

## 2017-05-06 ENCOUNTER — Encounter: Payer: Self-pay | Admitting: Obstetrics and Gynecology

## 2017-06-02 ENCOUNTER — Telehealth: Payer: Self-pay | Admitting: Family Medicine

## 2017-06-02 NOTE — Telephone Encounter (Signed)
She called in saying she was returning a call from the office.   Raelyn Ensign left a message that she wanted the pt to go to the ED to r/o meningitis. Ms. Ciampa didn't want to go to the ED but agreed to go after I explained the need for imaging and blood work that can't be done at the [place] office.  She is going to [facility] ED.  I routed a high priority note letting [facility] nurse pool aware of our conversation.

## 2017-06-02 NOTE — Telephone Encounter (Signed)
This patient made an appointment on Mychart for tomorrow 05/24/17.  States that she has stiff neck, headache, and fever 101.8.  She needs to go to the ER to rule out meningitis.

## 2017-06-02 NOTE — Telephone Encounter (Signed)
Left a message with Raelyn Ensign recommendations to go to ER due to symptoms of stiff neck, headache and fever to rule out possible meningitis. Cancelled appointment due to patient possibility needing imaging and blood work.

## 2017-06-03 ENCOUNTER — Ambulatory Visit: Payer: Self-pay | Admitting: Family Medicine

## 2017-06-03 ENCOUNTER — Telehealth: Payer: Self-pay

## 2017-06-03 NOTE — Telephone Encounter (Signed)
Per the request of Raelyn Ensign, I contacted this patient to see if she did do as instructed and go to the ED. Patient stated that she did not want to go there because she always gets sicker and waits for 5 hours. Instead she went to the urgent care [facility] walk-in) and was diagnosed with the flu, ear infection and bronchospasm (note is visible in care everywhere.  Patient was encouraged to take meds as directed and to take it easy. I also told her that I would inform Dr. Sanda Klein and that she might give her a call.  She said ok.

## 2017-06-03 NOTE — Telephone Encounter (Signed)
It appears Taylor Schwartz did not go to the ER yesterday.  Please call her back and ask if she is okay and again encourage her to seek emergency medical attention.  I spoke with Dr. Sanda Klein regarding her situation and she is also in agreement with this recommendation.

## 2017-06-03 NOTE — Telephone Encounter (Signed)
Spoke to patient, she stated that she went to UC at [facility] this morning. Have Flu and bronchitis.

## 2017-06-03 NOTE — Telephone Encounter (Signed)
Left message for patient to call office.  

## 2017-06-12 DIAGNOSIS — Z87442 Personal history of urinary calculi: Secondary | ICD-10-CM

## 2017-06-12 HISTORY — DX: Personal history of urinary calculi: Z87.442

## 2017-06-16 ENCOUNTER — Encounter: Payer: Self-pay | Admitting: Obstetrics and Gynecology

## 2017-06-16 ENCOUNTER — Ambulatory Visit (INDEPENDENT_AMBULATORY_CARE_PROVIDER_SITE_OTHER): Payer: Medicaid Other | Admitting: Obstetrics and Gynecology

## 2017-06-16 VITALS — BP 98/62 | HR 95 | Ht 65.0 in | Wt 142.0 lb

## 2017-06-16 DIAGNOSIS — N939 Abnormal uterine and vaginal bleeding, unspecified: Secondary | ICD-10-CM

## 2017-06-16 DIAGNOSIS — N809 Endometriosis, unspecified: Secondary | ICD-10-CM | POA: Diagnosis not present

## 2017-06-16 DIAGNOSIS — G8929 Other chronic pain: Secondary | ICD-10-CM | POA: Diagnosis not present

## 2017-06-16 DIAGNOSIS — R102 Pelvic and perineal pain: Secondary | ICD-10-CM

## 2017-06-16 MED ORDER — ELAGOLIX SODIUM 150 MG PO TABS: 1.0000 | ORAL_TABLET | Freq: Every day | ORAL | 5 refills | Status: DC

## 2017-06-16 NOTE — Progress Notes (Signed)
Obstetrics & Gynecology Office Visit   Chief Complaint:  Chief Complaint  Patient presents with  . Menometrorrhagia    History of Present Illness: 37 y.o. G1P1001 with laparoscopically proven endometriosis presenting for follow up to assess treatment response to continuous OCP.  She had trialed norethindrone but discontinued secondary to side-effects.  She reports continued and unchanged abdominal pain, as well as continued irregular bleeding.  She had not noted any vaginal discharge, urinary or GI symptoms, fevers or chills.  She presents today to discuss alternative treatment options.  Review of Systems: Review of Systems  Constitutional: Negative for chills and fever.  Gastrointestinal: Positive for abdominal pain. Negative for constipation, diarrhea, nausea and vomiting.  Genitourinary: Negative for dysuria.    Past Medical History:  Past Medical History:  Diagnosis Date  . Abnormal mammogram 05/2016  . Anxiety   . Complex cyst of left ovary 07/04/2016  . History of kidney stones   . PONV (postoperative nausea and vomiting)   . Tobacco abuse 07/04/2016    Past Surgical History:  Past Surgical History:  Procedure Laterality Date  . BREAST SURGERY  05/2016  . CHOLECYSTECTOMY    . LAPAROSCOPIC OVARIAN CYSTECTOMY Left 10/09/2016   Procedure: LAPAROSCOPIC OVARIAN CYSTECTOMY;  Surgeon: Malachy Mood, MD;  Location: [facility] ORS;  Service: Gynecology;  Laterality: Left;    Gynecologic History: Patient's last menstrual period was 05/13/2017 (approximate).  Obstetric History: G1P1001  Family History:  Family History  Problem Relation Age of Onset  . Lupus Mother   . Arthritis Mother   . Glaucoma Mother   . Vision loss Mother   . Diabetes Mother   . Ovarian cancer Mother 12  . Diabetes Father   . Prostate cancer Father 77  . Diabetes Maternal Grandmother   . Dementia Maternal Grandmother   . Diabetes Maternal Grandfather   . Pneumonia Maternal Grandfather   .  Diabetes Paternal Grandmother   . Diabetes Paternal Grandfather     Social History:  Social History   Socioeconomic History  . Marital status: Single    Spouse name: Not on file  . Number of children: Not on file  . Years of education: Not on file  . Highest education level: Not on file  Social Needs  . Financial resource strain: Not on file  . Food insecurity - worry: Not on file  . Food insecurity - inability: Not on file  . Transportation needs - medical: Not on file  . Transportation needs - non-medical: Not on file  Occupational History  . Not on file  Tobacco Use  . Smoking status: Current Every Day Smoker    Packs/day: 0.50  . Smokeless tobacco: Never Used  Substance and Sexual Activity  . Alcohol use: No  . Drug use: No  . Sexual activity: Yes    Birth control/protection: Pill  Other Topics Concern  . Not on file  Social History Narrative  . Not on file    Allergies:  Allergies  Allergen Reactions  . Cyclobenzaprine Itching  . Mobic [Meloxicam] Anaphylaxis  . Tramadol Hives  . Amoxicillin Hives and Itching  . Penicillins Hives, Itching and Rash    Has patient had a PCN reaction causing immediate rash, facial/tongue/throat swelling, SOB or lightheadedness with hypotension: Yes Has patient had a PCN reaction causing severe rash involving mucus membranes or skin necrosis: No Has patient had a PCN reaction that required hospitalization: No Has patient had a PCN reaction occurring within the last 10  years: No If all of the above answers are "NO", then may proceed with Cephalosporin use.     Medications: Prior to Admission medications   Medication Sig Start Date End Date Taking? Authorizing Provider  norgestimate-ethinyl estradiol (ORTHO-CYCLEN,SPRINTEC,PREVIFEM) 0.25-35 MG-MCG tablet Take 1 tablet by mouth daily. Skip the last 7 days of inactive pill and start a new pill pack 12/31/16 12/31/17 Yes Malachy Mood, MD  Norgestimate-Ethinyl Estradiol Triphasic  0.18/0.215/0.25 MG-25 MCG tab Take by mouth.   Yes [provider]    Physical Exam Vitals:  Vitals:   06/16/17 0805  BP: 98/62  Pulse: 95   Patient's last menstrual period was 05/13/2017 (approximate).  General: NAD HEENT: normocephalic, anicteric Pulmonary: No increased work of breathing Neurologic: Grossly intact Psychiatric: mood appropriate, affect full  Assessment: 37 y.o. G1P1001 pelvic pain secondary to endometriosis and abnormal uterine bleeding  Plan: Problem List Items Addressed This Visit      Other   Endometriosis determined by laparoscopy - Primary    Other Visit Diagnoses    Abnormal uterine bleeding       Chronic pelvic pain in female         1) Endometriosis - patient reported no improvement on norethindrone or continuous OCP, continues to have issues with menorrhagia and dysmenorrhea.  She declines trial of Mirena IUD or depo provera.  We will trial the patient on [place].  Discussed that she should not improvement within 6 weeks, if continued issues with bleeding discussed that limited options remain for long term management short of hysterectomy.  Expected side-effect profile reviewed.  Importance of non-hormonal contraception stressed.   2) A total of 15 minutes were spent in face-to-face contact with the patient during this encounter with over half of that time devoted to counseling and coordination of care.  3) Return in about 6 weeks (around 07/28/2017).

## 2017-07-03 ENCOUNTER — Encounter: Payer: Self-pay | Admitting: Obstetrics and Gynecology

## 2017-07-03 ENCOUNTER — Ambulatory Visit (INDEPENDENT_AMBULATORY_CARE_PROVIDER_SITE_OTHER): Payer: Medicaid Other | Admitting: Obstetrics and Gynecology

## 2017-07-03 VITALS — BP 110/70 | HR 85 | Ht 66.0 in | Wt 138.0 lb

## 2017-07-03 DIAGNOSIS — N809 Endometriosis, unspecified: Secondary | ICD-10-CM

## 2017-07-03 DIAGNOSIS — N939 Abnormal uterine and vaginal bleeding, unspecified: Secondary | ICD-10-CM

## 2017-07-03 NOTE — Progress Notes (Signed)
Obstetrics & Gynecology Office Visit   Chief Complaint:  Chief Complaint  Patient presents with  . Follow-up    medication f/u and surgical consult    History of Present Illness: 37 y.o. G1P1001 presenting for medication follow up and discussion of additional management option. The etiology of her pelvic pain is laparoscopically proven endometriosis.  She is currently being managed with NSAIDs and Orilissa.   The patient reports no improvement in symptoms.  On her current medication regimen has had poor cycle control with irregular or heavy periods.   She has not noted any side-effects or new symptoms.  She has previously tried NSAIDs, norethindrone, gabapentin, Orilissa and continous OCP.  Discontinued Freida Busman because of worsening problems with dysmenorrhea and irregular bleeding.  Poor cycle control was also her reason for discontinuing norethindrone prior.  She also feels like she has had the discomfort return on her left lower quadrant similar to when she went to the OR for her endometrioma last year.    Review of Systems: Review of Systems  Constitutional: Negative for chills and fever.  Gastrointestinal: Positive for abdominal pain. Negative for constipation, diarrhea, nausea and vomiting.  Genitourinary: Negative for dysuria, frequency and urgency.  Endo/Heme/Allergies: Does not bruise/bleed easily.     Past Medical History:  Past Medical History:  Diagnosis Date  . Abnormal mammogram 05/2016  . Anxiety   . Complex cyst of left ovary 07/04/2016  . History of kidney stones   . PONV (postoperative nausea and vomiting)   . Tobacco abuse 07/04/2016    Past Surgical History:  Past Surgical History:  Procedure Laterality Date  . BREAST SURGERY  05/2016  . CHOLECYSTECTOMY    . LAPAROSCOPIC OVARIAN CYSTECTOMY Left 10/09/2016   Procedure: LAPAROSCOPIC OVARIAN CYSTECTOMY;  Surgeon: Malachy Mood, MD;  Location: [facility] ORS;  Service: Gynecology;  Laterality: Left;     Gynecologic History: No LMP recorded. Patient is not currently having periods (Reason: Irregular Periods).  Obstetric History: G1P1001  Family History:  Family History  Problem Relation Age of Onset  . Lupus Mother   . Arthritis Mother   . Glaucoma Mother   . Vision loss Mother   . Diabetes Mother   . Ovarian cancer Mother 45  . Diabetes Father   . Prostate cancer Father 32  . Diabetes Maternal Grandmother   . Dementia Maternal Grandmother   . Diabetes Maternal Grandfather   . Pneumonia Maternal Grandfather   . Diabetes Paternal Grandmother   . Diabetes Paternal Grandfather     Social History:  Social History   Socioeconomic History  . Marital status: Single    Spouse name: Not on file  . Number of children: Not on file  . Years of education: Not on file  . Highest education level: Not on file  Social Needs  . Financial resource strain: Not on file  . Food insecurity - worry: Not on file  . Food insecurity - inability: Not on file  . Transportation needs - medical: Not on file  . Transportation needs - non-medical: Not on file  Occupational History  . Not on file  Tobacco Use  . Smoking status: Current Every Day Smoker    Packs/day: 0.50  . Smokeless tobacco: Never Used  Substance and Sexual Activity  . Alcohol use: No  . Drug use: No  . Sexual activity: Yes    Birth control/protection: None  Other Topics Concern  . Not on file  Social History Narrative  . Not  on file    Allergies:  Allergies  Allergen Reactions  . Cyclobenzaprine Itching  . Mobic [Meloxicam] Anaphylaxis  . Tramadol Hives  . Amoxicillin Hives and Itching  . Penicillins Hives, Itching and Rash    Has patient had a PCN reaction causing immediate rash, facial/tongue/throat swelling, SOB or lightheadedness with hypotension: Yes Has patient had a PCN reaction causing severe rash involving mucus membranes or skin necrosis: No Has patient had a PCN reaction that required  hospitalization: No Has patient had a PCN reaction occurring within the last 10 years: No If all of the above answers are "NO", then may proceed with Cephalosporin use.     Medications: Prior to Admission medications   Not on File    Physical Exam Vitals:  Vitals:   07/03/17 1045  BP: 110/70  Pulse: 85   No LMP recorded. Patient is not currently having periods (Reason: Irregular Periods).  General: NAD HEENT: normocephalic, anicteric Pulmonary: No increased work of breathing Neurologic: Grossly intact, normal gait Psychiatric: mood appropriate, affect full  Assessment: 37 y.o. G1P1001 medication follow up after starting [place]: Problem List Items Addressed This Visit      Other   Endometriosis determined by laparoscopy - Primary    Other Visit Diagnoses    Abnormal uterine bleeding (AUB)         1) Endometriosis - has discontinued Orilissa secondary to poor cycle control.  We discussed Mirena and or Depo Provera/Leupron.  At this point the patient is most interested in pursuing hysterectomy.  Patient opts for definitive surgical management via hysterectomy. The risks of surgery were discussed in detail with the patient including but not limited to: bleeding which may require transfusion or reoperation; infection which may require antibiotics; injury to bowel, bladder, ureters or other surrounding organs (With a literature reported rate of urinary tract injury of 1% quoted); need for additional procedures including laparotomy; thromboembolic phenomenon, incisional problems and other postoperative/anesthesia complications.  Patient was also advised that recovery procedure generally involves an overnight stay; and the  expected recovery time after a hysterectomy being in the range of 6-8 weeks.  Likelihood of success in alleviating the patient's symptoms was discussed.  While definitive in regards to issues with menstural bleeding, pelvic pain if present preoperatively may  continue and in fact worsen postoperatively.  She is aware that the procedure will render her unable to pursue childbearing in the future.   She was told that she will be contacted by our surgical scheduler regarding the time and date of her surgery; routine preoperative instructions of having nothing to eat or drink after midnight on the day prior to surgery and also coming to the hospital 1.5 hours prior to her time of surgery were also emphasized.  She was told she may be called for a preoperative appointment about a week prior to surgery and will be given further preoperative instructions at that visit.  Routine postoperative instructions will be reviewed with the patient and her family in detail after surgery. Printed patient education handouts about the procedure was given to the patient to review at home. - also plan on LSO left ovary contained endometrioma and she has had a return of symptoms and we discussed endometriomas do have a relatively high recurrence rate.   2) A total of 15 minutes were spent in face-to-face contact with the patient during this encounter with over half of that time devoted to counseling and coordination of care.    Malachy Mood, MD,  [facility] 07/06/2017, 8:04 AM

## 2017-07-07 ENCOUNTER — Telehealth: Payer: Self-pay | Admitting: Obstetrics and Gynecology

## 2017-07-07 NOTE — Telephone Encounter (Signed)
-----   Message from Malachy Mood, MD sent at 07/06/2017  8:02 AM EST ----- Surgery Date:   LOS: observation  Surgery Booking Request Patient Full Name: Taylor Schwartz MRN: [id number]  DOB: 07-19-1980  Surgeon: Malachy Mood, MD  Requested Surgery Date and Time: Sometime after March 15th Primary Diagnosis and Code: Endometriosis Secondary Diagnosis and Code: Abnormal uterine bleeding Surgical Procedure: TLH, LSO, RS, cystoscopy L&D Notification:N/A Admission Status: observation Length of Surgery: 2hr Special Case Needs: none H&P:  (date) Phone Interview or Office Pre-Admit: pre-admit Interpreter: No Language: English Medical Clearance: No Special Scheduling Instructions: none

## 2017-07-07 NOTE — Telephone Encounter (Signed)
Patient is aware of H&P at [facility] on 07/09/17 @ 2:50pm w/ Dr. Georgianne Fick, Pre-admit Testing to be scheduled, and OR on 07/16/17.

## 2017-07-08 NOTE — Telephone Encounter (Signed)
That should be fine. 

## 2017-07-09 ENCOUNTER — Ambulatory Visit (INDEPENDENT_AMBULATORY_CARE_PROVIDER_SITE_OTHER): Payer: Medicaid Other | Admitting: Obstetrics and Gynecology

## 2017-07-09 ENCOUNTER — Other Ambulatory Visit: Payer: Self-pay | Admitting: Obstetrics and Gynecology

## 2017-07-09 ENCOUNTER — Encounter: Payer: Self-pay | Admitting: Obstetrics and Gynecology

## 2017-07-09 VITALS — BP 98/68 | HR 85 | Ht 66.0 in | Wt 138.0 lb

## 2017-07-09 DIAGNOSIS — N809 Endometriosis, unspecified: Secondary | ICD-10-CM

## 2017-07-09 MED ORDER — SCOPOLAMINE 1 MG/3DAYS TD PT72: 1.0000 | MEDICATED_PATCH | TRANSDERMAL | 0 refills | Status: DC

## 2017-07-09 NOTE — Progress Notes (Signed)
[name]    Chief Complaint: Scheduled Surgery   History of Present Illness: Patient is a 37 y.o. G1P1001 presenting for scheduled TLH, LSO, RS, cystoscopy, for the treatment or further evaluation of endometriosis.   Prior Treatments prior to proceeding with surgery include: IUD, OCP, norethindrone, gabapentin, Orilissa  Pap 04/17/16 NIL  Review of Systems:10 point review of systems  Past Medical History:  Past Medical History:  Diagnosis Date  . Abnormal mammogram 05/2016  . Anxiety   . Complex cyst of left ovary 07/04/2016  . History of kidney stones   . PONV (postoperative nausea and vomiting)   . Tobacco abuse 07/04/2016    Past Surgical History:  Past Surgical History:  Procedure Laterality Date  . BREAST SURGERY  05/2016  . CHOLECYSTECTOMY    . LAPAROSCOPIC OVARIAN CYSTECTOMY Left 10/09/2016   Procedure: LAPAROSCOPIC OVARIAN CYSTECTOMY;  Surgeon: Malachy Mood, MD;  Location: [facility] ORS;  Service: Gynecology;  Laterality: Left;    Family History:  Family History  Problem Relation Age of Onset  . Lupus Mother   . Arthritis Mother   . Glaucoma Mother   . Vision loss Mother   . Diabetes Mother   . Ovarian cancer Mother 14  . Diabetes Father   . Prostate cancer Father 60  . Diabetes Maternal Grandmother   . Dementia Maternal Grandmother   . Diabetes Maternal Grandfather   . Pneumonia Maternal Grandfather   . Diabetes Paternal Grandmother   . Diabetes Paternal Grandfather     Social History:  Social History   Socioeconomic History  . Marital status: Single    Spouse name: Not on file  . Number of children: Not on file  . Years of education: Not on file  . Highest education level: Not on file  Social Needs  . Financial resource strain: Not on file  . Food insecurity - worry: Not on file  . Food insecurity - inability: Not on file  . Transportation needs - medical: Not on file  . Transportation needs - non-medical: Not on  file  Occupational History  . Not on file  Tobacco Use  . Smoking status: Current Every Day Smoker    Packs/day: 0.50  . Smokeless tobacco: Never Used  Substance and Sexual Activity  . Alcohol use: No  . Drug use: No  . Sexual activity: Yes    Birth control/protection: None  Other Topics Concern  . Not on file  Social History Narrative  . Not on file    Allergies:  Allergies  Allergen Reactions  . Cyclobenzaprine Itching  . Mobic [Meloxicam] Anaphylaxis  . Tramadol Hives  . Amoxicillin Hives and Itching    Has patient had a PCN reaction causing immediate rash, facial/tongue/throat swelling, SOB or lightheadedness with hypotension: Yes Has patient had a PCN reaction causing severe rash involving mucus membranes or skin necrosis: No Has patient had a PCN reaction that required hospitalization: No Has patient had a PCN reaction occurring within the last 10 years: No If all of the above answers are "NO", then may proceed with Cephalosporin use.    Marland Kitchen Penicillins Hives, Itching and Rash    Has patient had a PCN reaction causing immediate rash, facial/tongue/throat swelling, SOB or lightheadedness with hypotension: Yes Has patient had a PCN reaction causing severe rash involving mucus membranes or skin necrosis: No Has patient had a PCN reaction that required hospitalization: No Has patient had a PCN reaction occurring within the last 10 years:  No If all of the above answers are "NO", then may proceed with Cephalosporin use.     Medications: Prior to Admission medications   Medication Sig Start Date End Date Taking? Authorizing Provider  ibuprofen (ADVIL,MOTRIN) 200 MG tablet Take 200-400 mg by mouth every 8 (eight) hours as needed (for pain.).    [provider]    Physical Exam Vitals: Blood pressure 98/68, pulse 85, height 5\' 6"  (1.676 m), weight 138 lb (62.6 kg). General: NAD HEENT: normocephalic, anicteric Pulmonary: CTAB, No increased work of  breathing Cardiovascular: RRR, distal pulses 2+ Abdomen: soft, non-tender, non-distended Genitourinary: deferred Extremities: no edema, erythema, or tenderness Neurologic: Grossly intact Psychiatric: mood appropriate, affect full  Imaging No results found.  Assessment: 37 y.o. G1P1001 presenting for scheduled TLH, LSO, RS, cystoscopy  Plan: 1)Patient opts for definitive surgical management via hysterectomy. The risks of surgery were discussed in detail with the patient including but not limited to: bleeding which may require transfusion or reoperation; infection which may require antibiotics; injury to bowel, bladder, ureters or other surrounding organs (With a literature reported rate of urinary tract injury of 1% quoted); need for additional procedures including laparotomy; thromboembolic phenomenon, incisional problems and other postoperative/anesthesia complications.  Patient was also advised that recovery procedure generally involves an overnight stay; and the  expected recovery time after a hysterectomy being in the range of 6-8 weeks.  Likelihood of success in alleviating the patient's symptoms was discussed.  While definitive in regards to issues with menstural bleeding, pelvic pain if present preoperatively may continue and in fact worsen postoperatively.  She is aware that the procedure will render her unable to pursue childbearing in the future.   She was told that she will be contacted by our surgical scheduler regarding the time and date of her surgery; routine preoperative instructions of having nothing to eat or drink after midnight on the day prior to surgery and also coming to the hospital 1.5 hours prior to her time of surgery were also emphasized.  She was told she may be called for a preoperative appointment about a week prior to surgery and will be given further preoperative instructions at that visit.  Routine postoperative instructions will be reviewed with the patient and her  family in detail after surgery. Printed patient education handouts about the procedure was given to the patient to review at home.   2) Routine postoperative instructions were reviewed with the patient and her family in detail today including the expected length of recovery and likely postoperative course.  The patient concurred with the proposed plan, giving informed written consent for the surgery today.  Patient instructed on the importance of being NPO after midnight prior to her procedure.  If warranted preoperative prophylactic antibiotics and SCDs ordered on call to the OR to meet SCIP guidelines and adhere to recommendation laid forth in [place] Number 104 May 2009  "Antibiotic Prophylaxis for Gynecologic Procedures".     Malachy Mood, MD, [facility] 07/09/2017, 3:25 PM

## 2017-07-09 NOTE — Patient Instructions (Addendum)
Your procedure is scheduled on: Thursday, MARCH 7  Report to [place]  To find out your arrival time please call [phone] between 1PM - 3PM                        on Wednesday, MARCH 6  Remember: Instructions that are not followed completely may result in serious  medical risk, up to and including death, or upon the discretion of your surgeon  and anesthesiologist your surgery may need to be rescheduled.     _X__ 1. Do not eat food after midnight the night before your procedure.                 No gum chewing, lozengers or hard candies.                   You may drink clear liquids up to 2 hours before you are scheduled to arrive                 for your surgery- DO not drink clear                 liquids within 2 hours of the start of your surgery.                  Clear Liquids include:  water, apple juice without pulp, clear carbohydrate                 drink such as Clearfast of Gartorade, Black Coffee or Tea (Do not add                 anything to coffee or tea).  __X__2.  On the morning of surgery brush your teeth with toothpaste and water,                         you may rinse your mouth with mouthwash if you wish.                                 Do not swallow any toothpaste or mouthwash.     _X__ 3.  No Alcohol for 24 hours before or after surgery.   _X__ 4.  Do Not Smoke or use e-cigarettes For 24 Hours Prior to Your Surgery.                 Do not use any chewable tobacco products for at least 6 hours prior to                 surgery.  ____  5.  Bring all medications with you on the day of surgery if instructed.   __x__  6.  Notify your doctor if there is any change in your medical condition      (cold, fever, infections).     Do not wear jewelry, make-up, hairpins, clips or nail polish. Do not wear lotions, powders, or perfumes. You may wear deodorant. Do not shave 48 hours prior to surgery. Men may shave  face and neck. Do not bring valuables to the hospital.    [facility] is not responsible for any belongings or valuables.  Contacts, dentures or bridgework may not be  worn into surgery. Leave your suitcase in the car. After surgery it may be brought to your room. For patients admitted to the hospital, discharge time is determined by your treatment team.   Patients discharged the day of surgery will not be allowed to drive home.   Please read over the following fact sheets that you were given:   [place]      _X__ Take these medicines the morning of surgery with A SIP OF WATER:    1. NONE  2.   3.   4.  5.  6.  ____ Fleet Enema (as directed)   _X___ Use CHG Soap as directed  ____ Use inhalers on the day of surgery  _X___ Stop ALL ASPIRIN PRODUCTS NOW!!                  THIS INCLUDES EXCEDRIN / GOODYS POWER / BC  POWDER  _X___ Stop Anti-inflammatories NOW!!                 THIS INCLUDES IBUPROFEN / MOTRIN / ADVIL / ALEVE / NAPROSYN   ____ Stop supplements until after surgery.    ____ Bring C-Pap to the hospital.   YOU MAY TAKE TYLENOL IF NEEDED  HAVE STOOL SOFTENER READY  BRING COPY OF MEDICAL DIRECTIVES TO HOSPITAL IF COMPLETED  PICK UP SCOPOLAMINE PATCH AND APPLY AS DIRECTED

## 2017-07-10 ENCOUNTER — Telehealth: Payer: Self-pay

## 2017-07-10 ENCOUNTER — Other Ambulatory Visit: Payer: Self-pay

## 2017-07-10 ENCOUNTER — Encounter
Admission: RE | Admit: 2017-07-10 | Discharge: 2017-07-10 | Disposition: A | Discharge status: Home / Self Care | Payer: Medicaid Other | Source: Ambulatory Visit | Attending: Obstetrics and Gynecology | Admitting: Obstetrics and Gynecology

## 2017-07-10 DIAGNOSIS — Z01812 Encounter for preprocedural laboratory examination: Secondary | ICD-10-CM | POA: Diagnosis present

## 2017-07-10 HISTORY — DX: Endometriosis, unspecified: N80.9

## 2017-07-10 LAB — BASIC METABOLIC PANEL
ANION GAP: 8 (ref 5–15)
BUN: 12 mg/dL (ref 6–20)
CALCIUM: 9 mg/dL (ref 8.9–10.3)
CHLORIDE: 107 mmol/L (ref 101–111)
CO2: 23 mmol/L (ref 22–32)
Creatinine, Ser: 0.71 mg/dL (ref 0.44–1.00)
GFR calc non Af Amer: >60 mL/min (ref >60)
Glucose, Bld: 80 mg/dL (ref 65–99)
Potassium: 3.4 mmol/L — ABNORMAL LOW (ref 3.5–5.1)
SODIUM: 138 mmol/L (ref 135–145)

## 2017-07-10 LAB — TYPE AND SCREEN
ABO/RH(D): A NEG
Antibody Screen: NEGATIVE

## 2017-07-10 LAB — CBC
HCT: 38.7 % (ref 35.0–47.0)
HEMOGLOBIN: 13 g/dL (ref 12.0–16.0)
MCH: 29.3 pg (ref 26.0–34.0)
MCHC: 33.6 g/dL (ref 32.0–36.0)
MCV: 87.2 fL (ref 80.0–100.0)
PLATELETS: 266 10*3/uL (ref 150–440)
RBC: 4.44 MIL/uL (ref 3.80–5.20)
RDW: 13.5 % (ref 11.5–14.5)
WBC: 6.3 10*3/uL (ref 3.6–11.0)

## 2017-07-10 NOTE — Telephone Encounter (Signed)
Pt when to p/u patch rx from pharm and was tod they are waiting on authorization.  Told pt to call us.  [phone]

## 2017-07-10 NOTE — Telephone Encounter (Signed)
I have not received a PA form. Once received I will work on it. KJ CMA

## 2017-07-13 NOTE — Telephone Encounter (Signed)
We can do a different pharmacy but she'll probably need to pay out of pocket or we placed it day of surgery in the hospital

## 2017-07-13 NOTE — Telephone Encounter (Signed)
Pt states she knows it will be expensive at a different pharmacy so putting it on at the hospital day of surgery will be fine.

## 2017-07-13 NOTE — Telephone Encounter (Signed)
Pt states [facility] says even if they receive the prior auth on the nausea meds, they will not have it in stock for several months. Pt inquiring what approach AMS would like for her to take. Cb#[id number]

## 2017-07-13 NOTE — Telephone Encounter (Signed)
Please advise 

## 2017-07-13 NOTE — Telephone Encounter (Signed)
Patient is calling to follow up  About her medication. Please advise

## 2017-07-14 ENCOUNTER — Telehealth: Payer: Self-pay | Admitting: Obstetrics and Gynecology

## 2017-07-14 NOTE — Telephone Encounter (Signed)
Patient is having doubts about having the hysterectomy, and is scheduled for a conference w/ Dr. Georgianne Fick tomorrow afternoon to discuss options.

## 2017-07-14 NOTE — Telephone Encounter (Signed)
Patient is calling to speak with someone about needing to reschedule an procedure that is schedule 07/16/17. Please advise. Call patient .

## 2017-07-15 ENCOUNTER — Encounter: Payer: Self-pay | Admitting: Obstetrics and Gynecology

## 2017-07-15 ENCOUNTER — Ambulatory Visit: Payer: Medicaid Other | Admitting: Obstetrics and Gynecology

## 2017-07-15 VITALS — BP 100/58 | HR 115 | Wt 136.0 lb

## 2017-07-15 DIAGNOSIS — Z7189 Other specified counseling: Secondary | ICD-10-CM

## 2017-07-15 MED ORDER — GENTAMICIN SULFATE 40 MG/ML IJ SOLN
INTRAVENOUS | Status: DC
  Filled 2017-07-15: qty 7.75

## 2017-07-15 MED ORDER — GENTAMICIN SULFATE 40 MG/ML IJ SOLN
310.0000 mg | INTRAMUSCULAR | Status: AC
  Administered 2017-07-16: 310 mg via INTRAVENOUS
  Filled 2017-07-15: qty 7.75

## 2017-07-15 MED ORDER — CLINDAMYCIN PHOSPHATE 900 MG/50ML IV SOLN
900.0000 mg | INTRAVENOUS | Status: AC
  Administered 2017-07-16: 900 mg via INTRAVENOUS

## 2017-07-15 NOTE — Progress Notes (Signed)
Patient present to further discuss surgery.  She states she has not second thoughts.  Her son had autism and she has fear of conceiving again.  In addition she and her husband have been together for 18 years and have not conceived despite inconsistent contraception for the past 11 years.  The visit was more or less for her husband to have any questions answered but he was called into work.

## 2017-07-16 ENCOUNTER — Ambulatory Visit: Payer: Medicaid Other | Admitting: Certified Registered Nurse Anesthetist

## 2017-07-16 ENCOUNTER — Encounter: Payer: Self-pay | Admitting: *Deleted

## 2017-07-16 ENCOUNTER — Ambulatory Visit
Admission: RE | Admit: 2017-07-16 | Payer: Medicaid Other | Source: Ambulatory Visit | Admitting: Obstetrics and Gynecology

## 2017-07-16 ENCOUNTER — Observation Stay
Admission: RE | Admit: 2017-07-16 | Discharge: 2017-07-17 | Disposition: A | Discharge status: Home / Self Care | Payer: Medicaid Other | Source: Ambulatory Visit | Attending: Obstetrics and Gynecology | Admitting: Obstetrics and Gynecology

## 2017-07-16 ENCOUNTER — Other Ambulatory Visit: Payer: Self-pay

## 2017-07-16 ENCOUNTER — Encounter
Admission: RE | Disposition: A | Discharge status: Home / Self Care | Payer: Self-pay | Source: Ambulatory Visit | Attending: Obstetrics and Gynecology

## 2017-07-16 ENCOUNTER — Encounter: Admission: RE | Payer: Self-pay | Source: Ambulatory Visit

## 2017-07-16 DIAGNOSIS — F17210 Nicotine dependence, cigarettes, uncomplicated: Secondary | ICD-10-CM | POA: Diagnosis not present

## 2017-07-16 DIAGNOSIS — G8929 Other chronic pain: Secondary | ICD-10-CM | POA: Diagnosis not present

## 2017-07-16 DIAGNOSIS — N72 Inflammatory disease of cervix uteri: Secondary | ICD-10-CM | POA: Diagnosis not present

## 2017-07-16 DIAGNOSIS — N838 Other noninflammatory disorders of ovary, fallopian tube and broad ligament: Secondary | ICD-10-CM | POA: Insufficient documentation

## 2017-07-16 DIAGNOSIS — N939 Abnormal uterine and vaginal bleeding, unspecified: Secondary | ICD-10-CM

## 2017-07-16 DIAGNOSIS — R102 Pelvic and perineal pain: Secondary | ICD-10-CM

## 2017-07-16 DIAGNOSIS — N888 Other specified noninflammatory disorders of cervix uteri: Secondary | ICD-10-CM | POA: Diagnosis not present

## 2017-07-16 DIAGNOSIS — N800 Endometriosis of uterus: Secondary | ICD-10-CM

## 2017-07-16 DIAGNOSIS — N83202 Unspecified ovarian cyst, left side: Secondary | ICD-10-CM | POA: Insufficient documentation

## 2017-07-16 DIAGNOSIS — Z90710 Acquired absence of both cervix and uterus: Secondary | ICD-10-CM | POA: Diagnosis present

## 2017-07-16 DIAGNOSIS — N736 Female pelvic peritoneal adhesions (postinfective): Secondary | ICD-10-CM | POA: Insufficient documentation

## 2017-07-16 HISTORY — PX: LAPAROSCOPIC HYSTERECTOMY: SHX1926

## 2017-07-16 HISTORY — PX: CYSTOSCOPY: SHX5120

## 2017-07-16 LAB — POCT PREGNANCY, URINE: PREG TEST UR: NEGATIVE

## 2017-07-16 SURGERY — HYSTERECTOMY, TOTAL, LAPAROSCOPIC
Anesthesia: General | Wound class: Clean Contaminated

## 2017-07-16 SURGERY — HYSTERECTOMY, TOTAL, LAPAROSCOPIC
Anesthesia: Choice

## 2017-07-16 MED ORDER — LIDOCAINE HCL (PF) 1 % IJ SOLN
INTRAMUSCULAR | Status: AC
  Filled 2017-07-16: qty 30

## 2017-07-16 MED ORDER — DEXAMETHASONE SODIUM PHOSPHATE 10 MG/ML IJ SOLN
INTRAMUSCULAR | Status: DC | PRN
  Administered 2017-07-16: 10 mg via INTRAVENOUS

## 2017-07-16 MED ORDER — MIDAZOLAM HCL 2 MG/2ML IJ SOLN
INTRAMUSCULAR | Status: DC | PRN
  Administered 2017-07-16: 2 mg via INTRAVENOUS

## 2017-07-16 MED ORDER — SODIUM CHLORIDE FLUSH 0.9 % IV SOLN
INTRAVENOUS | Status: AC
  Filled 2017-07-16: qty 10

## 2017-07-16 MED ORDER — ONDANSETRON HCL 4 MG/2ML IJ SOLN
INTRAMUSCULAR | Status: DC | PRN
  Administered 2017-07-16: 4 mg via INTRAVENOUS

## 2017-07-16 MED ORDER — CLINDAMYCIN PHOSPHATE 900 MG/50ML IV SOLN
INTRAVENOUS | Status: AC
  Filled 2017-07-16: qty 50

## 2017-07-16 MED ORDER — SCOPOLAMINE 1 MG/3DAYS TD PT72
MEDICATED_PATCH | TRANSDERMAL | Status: AC
  Filled 2017-07-16: qty 1

## 2017-07-16 MED ORDER — LIDOCAINE HCL (CARDIAC) 20 MG/ML IV SOLN
INTRAVENOUS | Status: DC | PRN
Start: 2017-07-16 — End: 2017-07-16
  Administered 2017-07-16 (×2): 60 mg via INTRAVENOUS

## 2017-07-16 MED ORDER — PROMETHAZINE HCL 25 MG/ML IJ SOLN
INTRAMUSCULAR | Status: AC
  Filled 2017-07-16: qty 1

## 2017-07-16 MED ORDER — OXYCODONE HCL 5 MG PO TABS: 5.0000 mg | ORAL_TABLET | Freq: Once | ORAL | Status: DC | PRN

## 2017-07-16 MED ORDER — OXYCODONE HCL 5 MG/5ML PO SOLN: 5.0000 mg | Freq: Once | ORAL | Status: DC | PRN

## 2017-07-16 MED ORDER — MENTHOL 3 MG MT LOZG: 1.0000 | LOZENGE | OROMUCOSAL | Status: DC | PRN

## 2017-07-16 MED ORDER — SCOPOLAMINE 1 MG/3DAYS TD PT72
1.0000 | MEDICATED_PATCH | TRANSDERMAL | Status: DC
  Administered 2017-07-16: 1.5 mg via TRANSDERMAL

## 2017-07-16 MED ORDER — PROPOFOL 10 MG/ML IV BOLUS
INTRAVENOUS | Status: DC | PRN
  Administered 2017-07-16: 140 mg via INTRAVENOUS

## 2017-07-16 MED ORDER — MORPHINE SULFATE (PF) 2 MG/ML IV SOLN: 1.0000 mg | INTRAVENOUS | Status: DC | PRN

## 2017-07-16 MED ORDER — DEXTROSE-NACL 5-0.45 % IV SOLN
INTRAVENOUS | Status: DC
  Administered 2017-07-16: 13:00:00 via INTRAVENOUS

## 2017-07-16 MED ORDER — LACTATED RINGERS IV SOLN
INTRAVENOUS | Status: DC | PRN
  Administered 2017-07-16: 09:00:00 via INTRAVENOUS

## 2017-07-16 MED ORDER — FENTANYL CITRATE (PF) 100 MCG/2ML IJ SOLN
INTRAMUSCULAR | Status: DC | PRN
  Administered 2017-07-16: 50 ug via INTRAVENOUS

## 2017-07-16 MED ORDER — SCOPOLAMINE 1 MG/3DAYS TD PT72
1.0000 | MEDICATED_PATCH | TRANSDERMAL | Status: DC
Start: 2017-07-19 — End: 2017-07-17

## 2017-07-16 MED ORDER — FAMOTIDINE 20 MG PO TABS
ORAL_TABLET | ORAL | Status: AC
  Filled 2017-07-16: qty 1

## 2017-07-16 MED ORDER — PROMETHAZINE HCL 25 MG/ML IJ SOLN
6.2500 mg | INTRAMUSCULAR | Status: DC | PRN
  Administered 2017-07-16: 12.5 mg via INTRAVENOUS

## 2017-07-16 MED ORDER — KETOROLAC TROMETHAMINE 30 MG/ML IJ SOLN
INTRAMUSCULAR | Status: AC
  Administered 2017-07-16: 30 mg via INTRAVENOUS
  Filled 2017-07-16: qty 1

## 2017-07-16 MED ORDER — ONDANSETRON HCL 4 MG/2ML IJ SOLN: 4.0000 mg | Freq: Four times a day (QID) | INTRAMUSCULAR | Status: DC | PRN

## 2017-07-16 MED ORDER — FENTANYL CITRATE (PF) 100 MCG/2ML IJ SOLN
INTRAMUSCULAR | Status: AC
  Administered 2017-07-16: 25 ug via INTRAVENOUS
  Filled 2017-07-16: qty 2

## 2017-07-16 MED ORDER — PROPOFOL 500 MG/50ML IV EMUL
INTRAVENOUS | Status: DC | PRN
  Administered 2017-07-16: 100 ug/kg/min via INTRAVENOUS

## 2017-07-16 MED ORDER — LACTATED RINGERS IV SOLN
INTRAVENOUS | Status: DC
  Administered 2017-07-16: 09:00:00 via INTRAVENOUS

## 2017-07-16 MED ORDER — FAMOTIDINE 20 MG PO TABS
20.0000 mg | ORAL_TABLET | Freq: Once | ORAL | Status: AC
  Administered 2017-07-16: 20 mg via ORAL

## 2017-07-16 MED ORDER — PROMETHAZINE HCL 25 MG/ML IJ SOLN: 12.5000 mg | Freq: Four times a day (QID) | INTRAMUSCULAR | Status: DC | PRN

## 2017-07-16 MED ORDER — KETOROLAC TROMETHAMINE 30 MG/ML IJ SOLN: 30.0000 mg | Freq: Four times a day (QID) | INTRAMUSCULAR | Status: DC

## 2017-07-16 MED ORDER — ACETAMINOPHEN 10 MG/ML IV SOLN
INTRAVENOUS | Status: DC | PRN
  Administered 2017-07-16: 1000 mg via INTRAVENOUS

## 2017-07-16 MED ORDER — OXYCODONE-ACETAMINOPHEN 5-325 MG PO TABS
1.0000 | ORAL_TABLET | ORAL | Status: DC | PRN
  Administered 2017-07-16 – 2017-07-17 (×4): 1 via ORAL
  Filled 2017-07-16 (×4): qty 1

## 2017-07-16 MED ORDER — KETOROLAC TROMETHAMINE 30 MG/ML IJ SOLN
30.0000 mg | Freq: Four times a day (QID) | INTRAMUSCULAR | Status: DC
  Administered 2017-07-16 – 2017-07-17 (×4): 30 mg via INTRAVENOUS
  Filled 2017-07-16 (×3): qty 1

## 2017-07-16 MED ORDER — KETAMINE HCL 50 MG/ML IJ SOLN
INTRAMUSCULAR | Status: DC | PRN
  Administered 2017-07-16: 30 mg via INTRAMUSCULAR
  Administered 2017-07-16: 15 mg via INTRAMUSCULAR

## 2017-07-16 MED ORDER — SUGAMMADEX SODIUM 500 MG/5ML IV SOLN
INTRAVENOUS | Status: DC | PRN
  Administered 2017-07-16: 123.4 mg via INTRAVENOUS

## 2017-07-16 MED ORDER — FENTANYL CITRATE (PF) 100 MCG/2ML IJ SOLN
25.0000 ug | INTRAMUSCULAR | Status: DC | PRN
  Administered 2017-07-16 (×3): 25 ug via INTRAVENOUS

## 2017-07-16 MED ORDER — ROCURONIUM BROMIDE 100 MG/10ML IV SOLN
INTRAVENOUS | Status: DC | PRN
  Administered 2017-07-16: 40 mg via INTRAVENOUS
  Administered 2017-07-16: 10 mg via INTRAVENOUS
  Administered 2017-07-16: 5 mg via INTRAVENOUS

## 2017-07-16 SURGICAL SUPPLY — 51 items
ADH SKN CLS APL DERMABOND .7 (GAUZE/BANDAGES/DRESSINGS) ×2
APL SRG 38 LTWT LNG FL B (MISCELLANEOUS) ×2
APPLICATOR ARISTA FLEXITIP XL (MISCELLANEOUS) ×2 IMPLANT
BAG URINE DRAINAGE (UROLOGICAL SUPPLIES) ×4 IMPLANT
BLADE SURG SZ11 CARB STEEL (BLADE) ×4 IMPLANT
CANISTER SUCT 1200ML W/VALVE (MISCELLANEOUS) ×4 IMPLANT
CATH FOLEY 2WAY  5CC 16FR (CATHETERS) ×2
CATH FOLEY 2WAY 5CC 16FR (CATHETERS) ×2
CATH URTH 16FR FL 2W BLN LF (CATHETERS) ×2 IMPLANT
CHLORAPREP W/TINT 26ML (MISCELLANEOUS) ×4 IMPLANT
COUNTER NEEDLE 20/40 LG (NEEDLE) ×4 IMPLANT
DEFOGGER SCOPE WARMER CLEARIFY (MISCELLANEOUS) ×4 IMPLANT
DERMABOND ADVANCED (GAUZE/BANDAGES/DRESSINGS) ×2
DERMABOND ADVANCED .7 DNX12 (GAUZE/BANDAGES/DRESSINGS) ×2 IMPLANT
GLOVE BIO SURGEON STRL SZ7 (GLOVE) ×20 IMPLANT
GLOVE INDICATOR 7.5 STRL GRN (GLOVE) ×6 IMPLANT
GOWN STRL REUS W/ TWL LRG LVL3 (GOWN DISPOSABLE) ×4 IMPLANT
GOWN STRL REUS W/ TWL XL LVL3 (GOWN DISPOSABLE) ×2 IMPLANT
GOWN STRL REUS W/TWL LRG LVL3 (GOWN DISPOSABLE) ×8
GOWN STRL REUS W/TWL XL LVL3 (GOWN DISPOSABLE) ×4
HEMOSTAT ARISTA ABSORB 3G PWDR (MISCELLANEOUS) ×2 IMPLANT
IRRIGATION STRYKERFLOW (MISCELLANEOUS) ×2 IMPLANT
IRRIGATOR STRYKERFLOW (MISCELLANEOUS) ×4
IV LACTATED RINGERS 1000ML (IV SOLUTION) ×4 IMPLANT
IV NS 1000ML (IV SOLUTION) ×4
IV NS 1000ML BAXH (IV SOLUTION) ×2 IMPLANT
KIT PINK PAD W/HEAD ARE REST (MISCELLANEOUS) ×4
KIT PINK PAD W/HEAD ARM REST (MISCELLANEOUS) ×2 IMPLANT
LABEL OR SOLS (LABEL) ×4 IMPLANT
MANIPULATOR VCARE LG CRV RETR (MISCELLANEOUS) ×2 IMPLANT
NS IRRIG 1000ML POUR BTL (IV SOLUTION) ×4 IMPLANT
NS IRRIG 500ML POUR BTL (IV SOLUTION) ×4 IMPLANT
OCCLUDER COLPOPNEUMO (BALLOONS) IMPLANT
PACK GYN LAPAROSCOPIC (MISCELLANEOUS) ×4 IMPLANT
PAD OB MATERNITY 4.3X12.25 (PERSONAL CARE ITEMS) ×4 IMPLANT
PAD PREP 24X41 OB/GYN DISP (PERSONAL CARE ITEMS) ×4 IMPLANT
SET CYSTO W/LG BORE CLAMP LF (SET/KITS/TRAYS/PACK) ×4 IMPLANT
SHEARS HARMONIC ACE PLUS 36CM (ENDOMECHANICALS) ×4 IMPLANT
SLEEVE ENDOPATH XCEL 5M (ENDOMECHANICALS) ×8 IMPLANT
SPONGE LAP 18X18 5 PK (GAUZE/BANDAGES/DRESSINGS) ×4 IMPLANT
SPONGE XRAY 4X4 16PLY STRL (MISCELLANEOUS) ×4 IMPLANT
STRAP SAFETY 5IN WIDE (MISCELLANEOUS) ×4 IMPLANT
SURGILUBE 2OZ TUBE FLIPTOP (MISCELLANEOUS) ×4 IMPLANT
SUT VIC AB 0 CT1 27 (SUTURE) ×4
SUT VIC AB 0 CT1 27XCR 8 STRN (SUTURE) ×2 IMPLANT
SUT VIC AB 2-0 UR6 27 (SUTURE) ×4 IMPLANT
SUT VIC AB 4-0 FS2 27 (SUTURE) ×8 IMPLANT
SYR 10ML LL (SYRINGE) ×4 IMPLANT
SYR 50ML LL SCALE MARK (SYRINGE) ×4 IMPLANT
TROCAR XCEL NON-BLD 5MMX100MML (ENDOMECHANICALS) ×4 IMPLANT
TUBING INSUF HEATED (TUBING) ×4 IMPLANT

## 2017-07-16 NOTE — Op Note (Signed)
Preoperative Diagnosis: 1) 37 y.o. chronic pelvic pain and laparoscopically proven endometriosis 2) Abnormal uterine bleeding  Postoperative Diagnosis: 1) 37 y.o. chronic pelvic pain and laparoscopically proven endometriosis 2) Abnormal uterine bleeding   Operation Performed: Total laparoscopic hysterectomy, bilateral salpingectomy, left oophorectomy, and cystoscopy  Indication: Endometriosis failed medical management, prior left sided endometrioma  Surgeon: Malachy Mood, MD  Assistant: Barnett Applebaum, MD  Anesthesia: General  Preoperative Antibiotics: Clindamycin and gentamycin  Estimated Blood Loss: 40 mL  IV Fluids: 1038mL  Urine Output:: 581mL  Drains or Tubes: none  Implants: none  Specimens Removed: Uterus, cervix, and bilateral fallopian tubes, left ovary  Complications: none  Intraoperative Findings: Normal cervix, uterus, and tubes.  Small paratubal cyst on the right tube.  Left ovary with adhesions of sigmoid epiploica.   Small area of scar contracture and endometriosis left anterior lower pelvis.     Patient Condition: stable  Procedure in Detail:  Patient was taken to the operating room where she was administered general anesthesia.  She was positioned in the dorsal lithotomy position utilizing Allen stirups, prepped and draped in the usual sterile fashion.  Prior to proceeding with procedure a time out was performed.  Attention was turned to the patient's pelvis.  An indwelling foley catheter was placed to decompress the patient's bladder.  An operative speculum was placed to allow visualization of the cervix.  The anterior lip of the cervix was grasped with a single tooth tenaculum, and a large V-care uterine manipulator was placed to allow manipulation of the uterus.  The operative speculum and single tooth tenaculum were then removed.  Attention was turned to the patient's abdomen.  The umbilicus was infiltrated with 1% Sensorcaine, before making a stab  incision using an 11 blade scalpel.  A 45mm Excel trocar was then used to gain direct entry into the peritoneal cavity utilizing the camera to visualize progress of the trocar during placement.  Once peritoneal entry had been achieved, insufflation was started and pneumoperitoneum established at a pressure of 41mmHg.    One left and one right lower quadrant site were then injected with 1% Sensorcaine and a stab incision was made using an 11 blade scalpel.  Two additional 74mm Excel trocars were placed through these incisions under direct visualization. General inspection of the abdomen revealed the above noted findings.   The right tube was identified and grasped at its fimbriated end.  The tube was transected from its attachments to the ovary and mesosalpinx using a 63mm Harmonic scalpel.  The utero ovarian ligament was identified ligated and transected using the Harmonic scalpel. The round ligament was then likewise ligated and transected.  The anterior leaf of the broad ligament was dissected down to the level of the internal cervical os and a bladder flap was started.  The posterior leaf of the broad ligament was dissected down to the utero-sacral ligament.  The uterine artery was skeletonized before being ligated and transected using the Harmonic scalpel with cephelad pressure applied to the V-care device to assure lateralization of the ureter.  A bite was then taken with Harmonic medial to transected portio of uterine artery to further lateralize the ureter and vessel off the V-care cup.  The patient left adnexal structures were then dissected in similar fashion, with the exception that the infundibulopelvic ligament was transected instead of the the utero-ovarian to facilitate removal of the uterus.  .  The bladder flap was completed and the bladder mobilized off the V-care cup.  An anterior colpotomy  was scores and carried around in a clockwise fashion to free the specimen, which was then removed vaginally.   Inspection revealed all pedicles to be hemostatic before proceed with vaginal closure.  Attention was turned to the patient pelvis.  An operative speculum was placed and the anterior and posterior portion of the vaginal cuff were tagged with long Alice clamps.  The cuff was closed using interrupted figure of eight 0-Vicryl stitches.  The cuff was hemsotatic at the conclusion of closure without visible or palpable defects.  The indwelling foley catheter was removed.  Cystoscopy was performed noting and intact bladder dome as well as brisk efflux of urine from bother ureteral orifices.  The cystoscopy was removed and the indwelling foley catheter was replaced.  Pneumoperitoneum was re-established, the pelvis was irrigated and all pedicles were once again inspected and noted to be hemostatic.  The cuff appeared well closed and free of bowl or other tissue that may have inadvertently been incorporated into the vaginal closure.  Additional hemostatic agents were applied to all pedicles (Arista).      Pneumoperitoneum was evacuated and trocars were removed.  All trocar sites were then dressed with surgical skin glue.  Sponge needle and instrument counts were correct time two.  The patient tolerated the procedure well and was taken to the recovery room in stable condition.

## 2017-07-16 NOTE — OR Nursing (Signed)
Patient arrived in PACU very anxious c/o nausea med given patient resting at this time vss

## 2017-07-16 NOTE — Anesthesia Preprocedure Evaluation (Signed)
Anesthesia Evaluation  Patient identified by MRN, date of birth, ID band Patient awake    Reviewed: Allergy & Precautions, H&P , NPO status , Patient's Chart, lab work & pertinent test results  History of Anesthesia Complications (+) PONV and history of anesthetic complications  Airway Mallampati: II  TM Distance: >3 FB Neck ROM: full    Dental  (+) Chipped   Pulmonary neg shortness of breath, Current Smoker,           Cardiovascular Exercise Tolerance: Good (-) angina(-) Past MI and (-) DOE negative cardio ROS       Neuro/Psych PSYCHIATRIC DISORDERS Anxiety negative neurological ROS     GI/Hepatic negative GI ROS, Neg liver ROS, neg GERD  ,  Endo/Other  negative endocrine ROS  Renal/GU      Musculoskeletal   Abdominal   Peds  Hematology negative hematology ROS (+)   Anesthesia Other Findings Past Medical History: 05/2016: Abnormal mammogram No date: Anxiety 07/04/2016: Complex cyst of left ovary 2018: Endometriosis determined by laparoscopy 06/2017: History of kidney stones No date: PONV (postoperative nausea and vomiting) 07/04/2016: Tobacco abuse  Past Surgical History: 05/2016: BREAST SURGERY; Left     Comment:  benign. metal clip to identify area when mammograms 2008: CHOLECYSTECTOMY 10/09/2016: LAPAROSCOPIC OVARIAN CYSTECTOMY; Left     Comment:  Procedure: [place];  Surgeon:               Malachy Mood, MD;  Location: [facility] ORS;  Service:               Gynecology;  Laterality: Left; 2016: mole removed     Comment:  area under breast removed 2007: nsvd  BMI    Body Mass Index:  21.95 kg/m      Reproductive/Obstetrics negative OB ROS                             Anesthesia Physical Anesthesia Plan  ASA: II  Anesthesia Plan: General ETT   Post-op Pain Management:    Induction: Intravenous  PONV Risk Score and Plan: 4 or greater and  Ondansetron, Dexamethasone, Midazolam, Scopolamine patch - Pre-op, TIVA and Propofol infusion  Airway Management Planned: Oral ETT  Additional Equipment:   Intra-op Plan:   Post-operative Plan: Extubation in OR  Informed Consent: I have reviewed the patients History and Physical, chart, labs and discussed the procedure including the risks, benefits and alternatives for the proposed anesthesia with the patient or authorized representative who has indicated his/her understanding and acceptance.   Dental Advisory Given  Plan Discussed with: Anesthesiologist, CRNA and Surgeon  Anesthesia Plan Comments: (Patient consented for risks of anesthesia including but not limited to:  - adverse reactions to medications - damage to teeth, lips or other oral mucosa - sore throat or hoarseness - Damage to heart, brain, lungs or loss of life  Patient voiced understanding.)        Anesthesia Quick Evaluation

## 2017-07-16 NOTE — Anesthesia Postprocedure Evaluation (Signed)
Anesthesia Post Note  Patient: Taylor Schwartz  Procedure(s) Performed: HYSTERECTOMY TOTAL LAPAROSCOPIC,LEFT SALPINGO OPHORECTOMY,RIGHT SALPINGECTOMY (Bilateral ) CYSTOSCOPY (N/A )  Patient location during evaluation: PACU Anesthesia Type: General Level of consciousness: awake and alert Pain management: pain level controlled Vital Signs Assessment: post-procedure vital signs reviewed and stable Respiratory status: spontaneous breathing, nonlabored ventilation, respiratory function stable and patient connected to nasal cannula oxygen Cardiovascular status: blood pressure returned to baseline and stable Postop Assessment: no apparent nausea or vomiting Anesthetic complications: no     Last Vitals:  Vitals:   07/16/17 1154 07/16/17 1209  BP: 103/69 104/66  Pulse: 71 86  Resp: 20 (!) 23  Temp:    SpO2: 100% 100%    Last Pain:  Vitals:   07/16/17 1209  TempSrc:   PainSc: 8                  [name]

## 2017-07-16 NOTE — Transfer of Care (Signed)
Immediate Anesthesia Transfer of Care Note  Patient: Taylor Schwartz  Procedure(s) Performed: HYSTERECTOMY TOTAL LAPAROSCOPIC,LEFT SALPINGO OPHORECTOMY,RIGHT SALPINGECTOMY (Bilateral ) CYSTOSCOPY (N/A )  Patient Location: PACU  Anesthesia Type:General  Level of Consciousness: awake and alert   Airway & Oxygen Therapy: Patient Spontanous Breathing and Patient connected to nasal cannula oxygen  Post-op Assessment: Report given to RN and Post -op Vital signs reviewed and stable  Post vital signs: stable  Last Vitals:  Vitals:   07/16/17 0749 07/16/17 1054  BP: 109/64 (!) 116/98  Pulse: 80 (!) 25  Resp: 16 (!) 27  Temp: (!) 36.3 C (!) 36.2 C  SpO2: 100% 93%    Last Pain:  Vitals:   07/16/17 0749  TempSrc: Oral         Complications: No apparent anesthesia complications

## 2017-07-16 NOTE — Anesthesia Procedure Notes (Signed)
Procedure Name: Intubation Date/Time: 07/16/2017 9:09 AM Performed by: Willette Alma, CRNA Pre-anesthesia Checklist: Patient identified, Patient being monitored, Timeout performed, Emergency Drugs available and Suction available Patient Re-evaluated:Patient Re-evaluated prior to induction Oxygen Delivery Method: Circle system utilized Preoxygenation: Pre-oxygenation with 100% oxygen Induction Type: IV induction Ventilation: Mask ventilation without difficulty Laryngoscope Size: Mac and 3 Grade View: Grade I Tube type: Oral Tube size: 7.0 mm Number of attempts: 1 Airway Equipment and Method: Stylet Placement Confirmation: ETT inserted through vocal cords under direct vision,  positive ETCO2 and breath sounds checked- equal and bilateral Secured at: 21 cm Tube secured with: Tape Dental Injury: Teeth and Oropharynx as per pre-operative assessment

## 2017-07-16 NOTE — H&P (Signed)
Date of Initial H&P: 07/09/17  History reviewed, patient examined, no change in status, stable for surgery.

## 2017-07-16 NOTE — OR Nursing (Signed)
arousable patient will dry heave

## 2017-07-16 NOTE — Anesthesia Post-op Follow-up Note (Signed)
Anesthesia QCDR form completed.        

## 2017-07-17 ENCOUNTER — Encounter: Payer: Self-pay | Admitting: Obstetrics and Gynecology

## 2017-07-17 DIAGNOSIS — R102 Pelvic and perineal pain: Secondary | ICD-10-CM | POA: Diagnosis not present

## 2017-07-17 LAB — SURGICAL PATHOLOGY

## 2017-07-17 LAB — BASIC METABOLIC PANEL
Anion gap: 7 (ref 5–15)
BUN: 9 mg/dL (ref 6–20)
CO2: 21 mmol/L — ABNORMAL LOW (ref 22–32)
Calcium: 8.7 mg/dL — ABNORMAL LOW (ref 8.9–10.3)
Chloride: 109 mmol/L (ref 101–111)
Creatinine, Ser: 0.67 mg/dL (ref 0.44–1.00)
GFR calc Af Amer: >60 mL/min (ref >60)
GFR calc non Af Amer: >60 mL/min (ref >60)
Glucose, Bld: 147 mg/dL — ABNORMAL HIGH (ref 65–99)
Potassium: 4.2 mmol/L (ref 3.5–5.1)
Sodium: 137 mmol/L (ref 135–145)

## 2017-07-17 LAB — CBC
HCT: 32.5 % — ABNORMAL LOW (ref 35.0–47.0)
HEMOGLOBIN: 11.2 g/dL — AB (ref 12.0–16.0)
MCH: 30 pg (ref 26.0–34.0)
MCHC: 34.4 g/dL (ref 32.0–36.0)
MCV: 87.4 fL (ref 80.0–100.0)
Platelets: 242 10*3/uL (ref 150–440)
RBC: 3.72 MIL/uL — ABNORMAL LOW (ref 3.80–5.20)
RDW: 14 % (ref 11.5–14.5)
WBC: 15 10*3/uL — ABNORMAL HIGH (ref 3.6–11.0)

## 2017-07-17 MED ORDER — IBUPROFEN 600 MG PO TABS: 600.0000 mg | ORAL_TABLET | Freq: Three times a day (TID) | ORAL | 0 refills | Status: DC | PRN

## 2017-07-17 MED ORDER — OXYCODONE-ACETAMINOPHEN 5-325 MG PO TABS: 1.0000 | ORAL_TABLET | ORAL | 0 refills | Status: DC | PRN

## 2017-07-17 NOTE — Progress Notes (Signed)
Patient discharged home with significant other. Discharge instructions, prescriptions and follow up appointment given to and reviewed with patient and significant other. Patient verbalized understanding. Escorted out via wheelchair by auxiliary.

## 2017-07-17 NOTE — Discharge Summary (Signed)
Physician Discharge Summary  Patient ID: Taylor Schwartz MRN: [id number] DOB/AGE: Sep 21, 1980 37 y.o.  Admit date: 07/16/2017 Discharge date: 07/17/2017  Admission Diagnoses: Endometriosis  Discharge Diagnoses:  Active Problems:   S/P laparoscopic hysterectomy   Discharged Condition: good  Hospital Course: 37 year old with laparoscopically proved endometriosis failing medical management presenting for TLH, BS, LO, cystoscopy.  Procedure uncomplicated.  The patient has had issues with nausea secondary to anesthesia and this was encountered postoperatively again.  She improved very rapidly though, tolerated po by the evening of POD0 was saline locked and had her catheter removed.  Pain was well controlled on po analgesics.  She was ambulating.  Throughout the admission the patient remained hemodynamically stable and afebrile.    Consults: None  Significant Diagnostic Studies: Results for orders placed or performed during the hospital encounter of 07/16/17 (from the past 72 hour(s))  Pregnancy, urine POC     Status: None   Collection Time: 07/16/17  7:47 AM  Result Value Ref Range   Preg Test, Ur NEGATIVE NEGATIVE    Comment:        THE SENSITIVITY OF THIS METHODOLOGY IS >24 mIU/mL   CBC     Status: Abnormal   Collection Time: 07/17/17  6:25 AM  Result Value Ref Range   WBC 15.0 (H) 3.6 - 11.0 K/uL   RBC 3.72 (L) 3.80 - 5.20 MIL/uL   Hemoglobin 11.2 (L) 12.0 - 16.0 g/dL   HCT 32.5 (L) 35.0 - 47.0 %   MCV 87.4 80.0 - 100.0 fL   MCH 30.0 26.0 - 34.0 pg   MCHC 34.4 32.0 - 36.0 g/dL   RDW 14.0 11.5 - 14.5 %   Platelets 242 150 - 440 K/uL    Comment: Performed at [facility], [address] [place], [place] [address]  Basic metabolic panel     Status: Abnormal   Collection Time: 07/17/17  6:25 AM  Result Value Ref Range   Sodium 137 135 - 145 mmol/L   Potassium 4.2 3.5 - 5.1 mmol/L   Chloride 109 101 - 111 mmol/L   CO2 21 (L) 22 - 32 mmol/L   Glucose, Bld 147 (H) 65  - 99 mg/dL   BUN 9 6 - 20 mg/dL   Creatinine, Ser 0.67 0.44 - 1.00 mg/dL   Calcium 8.7 (L) 8.9 - 10.3 mg/dL   GFR calc non Af Amer >60 >60 mL/min   GFR calc Af Amer >60 >60 mL/min    Comment: (NOTE) The eGFR has been calculated using the CKD EPI equation. This calculation has not been validated in all clinical situations. eGFR's persistently <60 mL/min signify possible Chronic Kidney Disease.    Anion gap 7 5 - 15    Comment: Performed at [facility], [address] [place], [place] [address]     Treatments: Surgery total laparoscopic hysterectomy, bilateral salingectomy, left oophorectomy, and cystoscopy  Discharge Exam: Blood pressure (!) 94/53, pulse (!) 56, temperature 98 F (36.7 C), temperature source Oral, resp. rate 18, height '5\' 6"'  (1.676 m), weight 136 lb (61.7 kg), last menstrual period 06/30/2017, SpO2 99 %. General appearance: alert, appears stated age and no distress Resp: no increased work of breathing  Disposition: 01-Home or Self Care  Discharge Instructions    Call MD for:   Complete by:  As directed    Heavy vaginal bleeding greater than 1 pad an hour   Call MD for:  difficulty breathing, headache or visual disturbances  Complete by:  As directed    Call MD for:  extreme fatigue   Complete by:  As directed    Call MD for:  hives   Complete by:  As directed    Call MD for:  persistant dizziness or light-headedness   Complete by:  As directed    Call MD for:  persistant nausea and vomiting   Complete by:  As directed    Call MD for:  redness, tenderness, or signs of infection (pain, swelling, redness, odor or green/yellow discharge around incision site)   Complete by:  As directed    Call MD for:  severe uncontrolled pain   Complete by:  As directed    Call MD for:  temperature >100.4   Complete by:  As directed    Diet general   Complete by:  As directed    Discharge wound care:   Complete by:  As directed    You may apply a light  dressing for minor discharge from the incision or to keep waistbands of clothing from rubbing.  You may also have been discharge with a clear dressing in which case this will be removed at your postoperative clinic visit.  You may shower, use soap on your incision.  Avoid baths or soaking the incision in the first 6 weeks following your surgery..   Driving restriction   Complete by:  As directed    Avoid driving for at least 2 weeks or while taking prescription narcotics.   Lifting restrictions   Complete by:  As directed    Weight restriction of 10lbs for 6 weeks.   Sexual acrtivity   Complete by:  As directed    No intercourse, tampons, or anything vaginally for 6 weeks     Allergies as of 07/17/2017      Reactions   Cyclobenzaprine Itching   Mobic [meloxicam] Anaphylaxis   Tramadol Hives   Amoxicillin Hives, Itching   Has patient had a PCN reaction causing immediate rash, facial/tongue/throat swelling, SOB or lightheadedness with hypotension: Yes Has patient had a PCN reaction causing severe rash involving mucus membranes or skin necrosis: No Has patient had a PCN reaction that required hospitalization: No Has patient had a PCN reaction occurring within the last 10 years: No If all of the above answers are "NO", then may proceed with Cephalosporin use.   Penicillins Hives, Itching, Rash   Has patient had a PCN reaction causing immediate rash, facial/tongue/throat swelling, SOB or lightheadedness with hypotension: Yes Has patient had a PCN reaction causing severe rash involving mucus membranes or skin necrosis: No Has patient had a PCN reaction that required hospitalization: No Has patient had a PCN reaction occurring within the last 10 years: No If all of the above answers are "NO", then may proceed with Cephalosporin use.      Medication List    STOP taking these medications   scopolamine 1 MG/3DAYS Commonly known as:  TRANSDERM-SCOP (1.5 MG)     TAKE these medications    ibuprofen 600 MG tablet Commonly known as:  ADVIL,MOTRIN Take 1 tablet (600 mg total) by mouth every 8 (eight) hours as needed for mild pain or moderate pain (for pain.). What changed:    medication strength  how much to take  reasons to take this   oxyCODONE-acetaminophen 5-325 MG tablet Commonly known as:  PERCOCET/ROXICET Take 1 tablet by mouth every 4 (four) hours as needed for moderate pain or severe pain.  Discharge Care Instructions  (From admission, onward)        Start     Ordered   07/17/17 0000  Discharge wound care:    Comments:  You may apply a light dressing for minor discharge from the incision or to keep waistbands of clothing from rubbing.  You may also have been discharge with a clear dressing in which case this will be removed at your postoperative clinic visit.  You may shower, use soap on your incision.  Avoid baths or soaking the incision in the first 6 weeks following your surgery.Marland Kitchen   07/17/17 0757       Signed: Malachy Mood 07/17/2017, 7:57 AM

## 2017-07-20 ENCOUNTER — Encounter: Payer: Self-pay | Admitting: Obstetrics and Gynecology

## 2017-07-24 ENCOUNTER — Ambulatory Visit (INDEPENDENT_AMBULATORY_CARE_PROVIDER_SITE_OTHER): Payer: Medicaid Other | Admitting: Obstetrics and Gynecology

## 2017-07-24 ENCOUNTER — Encounter: Payer: Self-pay | Admitting: Obstetrics and Gynecology

## 2017-07-24 VITALS — BP 120/84 | HR 95 | Wt 136.0 lb

## 2017-07-24 DIAGNOSIS — Z09 Encounter for follow-up examination after completed treatment for conditions other than malignant neoplasm: Secondary | ICD-10-CM

## 2017-07-24 MED ORDER — NORETHINDRONE ACETATE 5 MG PO TABS: 5.0000 mg | ORAL_TABLET | Freq: Every day | ORAL | 11 refills | Status: DC

## 2017-07-24 NOTE — Progress Notes (Signed)
Postoperative Follow-up Patient presents post op from [place], BS, LO, cysto3 1weeks ago for abnormal uterine bleeding and endometriosis.  Subjective: Patient reports marked improvement in her preop symptoms. Eating a regular diet without difficulty. Pain is controlled with current analgesics. Medications being used: acetaminophen, ibuprofen (OTC) and percocet.  Activity: normal activities of daily living.  Objective: Blood pressure 120/84, pulse 95, weight 136 lb (61.7 kg), last menstrual period 06/30/2017.  Gen: NAD Abdomen: soft, non-tender, non-distended incisions D/C/I Ext: no edema  Admission on 07/16/2017, Discharged on 07/17/2017  Component Date Value Ref Range Status  . Preg Test, Ur 07/16/2017 NEGATIVE  NEGATIVE Final   Comment:        THE SENSITIVITY OF THIS METHODOLOGY IS >24 mIU/mL   . SURGICAL PATHOLOGY 07/16/2017    Final                   Value:Surgical Pathology CASE: [id number] PATIENT: Taylor Schwartz Surgical Pathology Report     SPECIMEN SUBMITTED: A. Uterus, cervix, bilateral tubes, and left ovary  CLINICAL HISTORY: None provided  PRE-OPERATIVE DIAGNOSIS: Endometriosis, abnormal uterine bleeding  POST-OPERATIVE DIAGNOSIS: Same as pre-op     DIAGNOSIS: A. UTERUS WITH CERVIX, BILATERAL FALLOPIAN TUBES AND LEFT OVARY; TOTAL HYSTERECTOMY WITH BILATERAL SALPINGECTOMY AND LEFT OOPHORECTOMY: - CHRONIC CERVICITIS WITH NABOTHIAN CYSTS. - PROLIFERATIVE ENDOMETRIUM. - MYOMETRIUM WITH ADENOMYOSIS. - OVARY WITH FIBROUS SEROSAL ADHESIONS AND CYSTIC FOLLICLES. - BILATERAL FALLOPIAN TUBES WITH FIMBRIATED END. - SEPARATE BENIGN PARATUBAL CYST. - NEGATIVE FOR ATYPIA AND MALIGNANCY.   GROSS DESCRIPTION:  A. The specimen is received in a formalin-filled container labeled with the patient's name and uterus, cervix, bilateral tubes and left ovary.  Weight: 83 grams Dimensions:      Fundus -5.1 x 4.                         5 x 3.3 cm      Cervix  -5.0 x 4.3 cm with a 1 cm external os Serosa: wrinkled pink Cervix: smooth pink-tan with focal cysts Endocervix: trabecular pink-tan Endometrial cavity:      Dimensions -3.0 x 2.2 cm      Thickness -0.1- 0.2 cm      Other findings -no additional noted Myometrium:     Thickness -2.2 cm     Other findings -none noted Adnexa:       Right fallopian tube           Measurements 5.3 cm in length x 1.5 cm in diameter           Other findings -pink fimbriated      Left ovary           Measurement -1.8 x 1.5 x 0.9 cm           Serosa -Tan with fibrous adhesion           Cut surface -heterogeneous pink-tan      Left fallopian tube            Measurements -4.5 cm in length x 1.4 cm in diameter           Other findings -purple tan fimbriated with fimbria focally attached to ovary Other comments: free-floating in the container is a 0.4 x 0.3 x 0.1 cm collapsed cyst  Block summary: 1-representative anterior cervix 2-representative posterior cervix  3-representative anterior endomyometrium 4-representative posterior endomyometrium 5-representative cross-sections and longitudinal fimbriated end right fallopian tube 6-representative cross-section and longitudinal fimbriated end left fallopian tube 7-representative left ovary 8-entire free-floating collapsed cyst  Final Diagnosis performed by Quay Burow, MD.  Electronically signed 07/17/2017 2:43:12PM    The electronic signature indicates that the named Attending Pathologist has evaluated the specimen  Technical component performed at [facility], [address] [place], [place] [address] Lab: [id number] Dir: Rush Farmer, MD, MMM  Professional component performed at [facility], [facility], [place], [place], [place] [address] Lab: [id number] Dir: Dellia Nims. Rubinas, MD    . WBC 07/17/2017 15.0* 3.6 - 11.0 K/uL Final  . RBC 07/17/2017 3.72* 3.80 - 5.20 MIL/uL Final  . Hemoglobin  07/17/2017 11.2* 12.0 - 16.0 g/dL Final  . HCT 07/17/2017 32.5* 35.0 - 47.0 % Final  . MCV 07/17/2017 87.4  80.0 - 100.0 fL Final  . MCH 07/17/2017 30.0  26.0 - 34.0 pg Final  . MCHC 07/17/2017 34.4  32.0 - 36.0 g/dL Final  . RDW 07/17/2017 14.0  11.5 - 14.5 % Final  . Platelets 07/17/2017 242  150 - 440 K/uL Final   Performed at [facility], [address] [place], [place] [address]  . Sodium 07/17/2017 137  135 - 145 mmol/L Final  . Potassium 07/17/2017 4.2  3.5 - 5.1 mmol/L Final  . Chloride 07/17/2017 109  101 - 111 mmol/L Final  . CO2 07/17/2017 21* 22 - 32 mmol/L Final  . Glucose, Bld 07/17/2017 147* 65 - 99 mg/dL Final  . BUN 07/17/2017 9  6 - 20 mg/dL Final  . Creatinine, Ser 07/17/2017 0.67  0.44 - 1.00 mg/dL Final  . Calcium 07/17/2017 8.7* 8.9 - 10.3 mg/dL Final  . GFR calc non Af Amer 07/17/2017 >60  >60 mL/min Final  . GFR calc Af Amer 07/17/2017 >60  >60 mL/min Final   Comment: (NOTE) The eGFR has been calculated using the CKD EPI equation. This calculation has not been validated in all clinical situations. eGFR's persistently <60 mL/min signify possible Chronic Kidney Disease.   [name] gap 07/17/2017 7  5 - 15 Final   Performed at [facility], [address] [place], [place] [address]    Assessment: 37 y.o. s/p TLH, BS, LO, cysto stable  Plan: Patient has done well after surgery with no apparent complications.  I have discussed the post-operative course to date, and the expected progress moving forward.  The patient understands what complications to be concerned about.  I will see the patient in routine follow up, or sooner if needed.    Activity plan: No heavy lifting.  - norethindrone for long term management of endometriosis  Malachy Mood, MD, [facility] 07/24/2017, 12:17 PM

## 2017-07-28 ENCOUNTER — Encounter: Payer: Self-pay | Admitting: Obstetrics and Gynecology

## 2017-07-29 ENCOUNTER — Encounter: Payer: Self-pay | Admitting: Obstetrics and Gynecology

## 2017-08-26 ENCOUNTER — Ambulatory Visit (INDEPENDENT_AMBULATORY_CARE_PROVIDER_SITE_OTHER): Payer: Medicaid Other | Admitting: Obstetrics and Gynecology

## 2017-08-26 ENCOUNTER — Encounter: Payer: Self-pay | Admitting: Obstetrics and Gynecology

## 2017-08-26 VITALS — BP 98/50 | HR 87 | Ht 66.0 in | Wt 136.0 lb

## 2017-08-26 DIAGNOSIS — Z4889 Encounter for other specified surgical aftercare: Secondary | ICD-10-CM

## 2017-08-26 NOTE — Progress Notes (Signed)
Postoperative Follow-up Patient presents post op from [place], BS, cystoscopy 6weeks ago for abnormal uterine bleeding.  Subjective: Patient reports marked improvement in her preop symptoms. Eating a regular diet without difficulty. The patient is not having any pain.  Activity: normal activities of daily living.  Objective: Blood pressure (!) 98/50, pulse 87, height '5\' 6"'  (1.676 m), weight 136 lb (61.7 kg), last menstrual period 06/30/2017.  Gen: NAD HEENT: normocephalic, anicteric GU: normal external female genitalia, vaginal cuff well healed with small area of granulation tissue noted at left aspect of cuff. Ext: no edema  Admission on 07/16/2017, Discharged on 07/17/2017  Component Date Value Ref Range Status  . Preg Test, Ur 07/16/2017 NEGATIVE  NEGATIVE Final   Comment:        THE SENSITIVITY OF THIS METHODOLOGY IS >24 mIU/mL   . SURGICAL PATHOLOGY 07/16/2017    Final                   Value:Surgical Pathology CASE: [id number] PATIENT: Taylor Schwartz Surgical Pathology Report     SPECIMEN SUBMITTED: A. Uterus, cervix, bilateral tubes, and left ovary  CLINICAL HISTORY: None provided  PRE-OPERATIVE DIAGNOSIS: Endometriosis, abnormal uterine bleeding  POST-OPERATIVE DIAGNOSIS: Same as pre-op     DIAGNOSIS: A. UTERUS WITH CERVIX, BILATERAL FALLOPIAN TUBES AND LEFT OVARY; TOTAL HYSTERECTOMY WITH BILATERAL SALPINGECTOMY AND LEFT OOPHORECTOMY: - CHRONIC CERVICITIS WITH NABOTHIAN CYSTS. - PROLIFERATIVE ENDOMETRIUM. - MYOMETRIUM WITH ADENOMYOSIS. - OVARY WITH FIBROUS SEROSAL ADHESIONS AND CYSTIC FOLLICLES. - BILATERAL FALLOPIAN TUBES WITH FIMBRIATED END. - SEPARATE BENIGN PARATUBAL CYST. - NEGATIVE FOR ATYPIA AND MALIGNANCY.   GROSS DESCRIPTION:  A. The specimen is received in a formalin-filled container labeled with the patient's name and uterus, cervix, bilateral tubes and left ovary.  Weight: 83 grams Dimensions:      Fundus -5.1 x 4.                  5 x 3.3 cm      Cervix -5.0 x 4.3 cm with a 1 cm external os Serosa: wrinkled pink Cervix: smooth pink-tan with focal cysts Endocervix: trabecular pink-tan Endometrial cavity:      Dimensions -3.0 x 2.2 cm      Thickness -0.1- 0.2 cm      Other findings -no additional noted Myometrium:     Thickness -2.2 cm     Other findings -none noted Adnexa:       Right fallopian tube           Measurements 5.3 cm in length x 1.5 cm in diameter           Other findings -pink fimbriated      Left ovary           Measurement -1.8 x 1.5 x 0.9 cm           Serosa -Tan with fibrous adhesion           Cut surface -heterogeneous pink-tan      Left fallopian tube            Measurements -4.5 cm in length x 1.4 cm in diameter           Other findings -purple tan fimbriated with fimbria focally attached to ovary Other comments: free-floating in the container is a 0.4 x 0.3 x 0.1 cm collapsed cyst  Block summary: 1-representative anterior cervix 2-representative posterior cervix  3-representative anterior endomyometrium 4-representative posterior endomyometrium 5-representative cross-sections and longitudinal fimbriated end right fallopian tube 6-representative cross-section and longitudinal fimbriated end left fallopian tube 7-representative left ovary 8-entire free-floating collapsed cyst  Final Diagnosis performed by Quay Burow, MD.  Electronically signed 07/17/2017 2:43:12PM    The electronic signature indicates that the named Attending Pathologist has evaluated the specimen  Technical component performed at [facility], [address], [place], [place] [address] Lab: [phone] Dir: Rush Farmer, MD, MMM  Professional component performed at [facility], [facility], [place], [place], [place] [address] Lab: [phone] Dir: Dellia Nims. Rubinas, MD    . WBC 07/17/2017 15.0* 3.6 - 11.0 K/uL Final  . RBC 07/17/2017 3.72*  3.80 - 5.20 MIL/uL Final  . Hemoglobin 07/17/2017 11.2* 12.0 - 16.0 g/dL Final  . HCT 07/17/2017 32.5* 35.0 - 47.0 % Final  . MCV 07/17/2017 87.4  80.0 - 100.0 fL Final  . MCH 07/17/2017 30.0  26.0 - 34.0 pg Final  . MCHC 07/17/2017 34.4  32.0 - 36.0 g/dL Final  . RDW 07/17/2017 14.0  11.5 - 14.5 % Final  . Platelets 07/17/2017 242  150 - 440 K/uL Final   Performed at [facility], [address], [place], [place] [address]  . Sodium 07/17/2017 137  135 - 145 mmol/L Final  . Potassium 07/17/2017 4.2  3.5 - 5.1 mmol/L Final  . Chloride 07/17/2017 109  101 - 111 mmol/L Final  . CO2 07/17/2017 21* 22 - 32 mmol/L Final  . Glucose, Bld 07/17/2017 147* 65 - 99 mg/dL Final  . BUN 07/17/2017 9  6 - 20 mg/dL Final  . Creatinine, Ser 07/17/2017 0.67  0.44 - 1.00 mg/dL Final  . Calcium 07/17/2017 8.7* 8.9 - 10.3 mg/dL Final  . GFR calc non Af Amer 07/17/2017 >60  >60 mL/min Final  . GFR calc Af Amer 07/17/2017 >60  >60 mL/min Final   Comment: (NOTE) The eGFR has been calculated using the CKD EPI equation. This calculation has not been validated in all clinical situations. eGFR's persistently <60 mL/min signify possible Chronic Kidney Disease.   [name] gap 07/17/2017 7  5 - 15 Final   Performed at [facility], [address] [place], [place] [address]    Assessment: 37 y.o. s/p TLH, BS, cysto stable  Plan: Patient has done well after surgery with no apparent complications.  I have discussed the post-operative course to date, and the expected progress moving forward.  The patient understands what complications to be concerned about.  I will see the patient in routine follow up, or sooner if needed.    Activity plan: No restriction.  Continue norethindrone for endometriosis   Malachy Mood, MD, [place], [facility] 08/27/2017, 2:54 PM

## 2018-01-17 ENCOUNTER — Encounter: Payer: Self-pay | Admitting: Family Medicine

## 2018-01-18 ENCOUNTER — Ambulatory Visit: Payer: Medicaid Other | Admitting: Family Medicine

## 2018-01-18 ENCOUNTER — Other Ambulatory Visit: Payer: Self-pay | Admitting: Family Medicine

## 2018-01-18 ENCOUNTER — Ambulatory Visit
Admission: RE | Admit: 2018-01-18 | Discharge: 2018-01-18 | Disposition: A | Discharge status: Home / Self Care | Payer: Medicaid Other | Source: Ambulatory Visit | Attending: Family Medicine | Admitting: Family Medicine

## 2018-01-18 ENCOUNTER — Encounter: Payer: Self-pay | Admitting: Family Medicine

## 2018-01-18 VITALS — BP 130/90 | HR 80 | Temp 98.2°F | Resp 16 | Ht 66.0 in | Wt 136.7 lb

## 2018-01-18 DIAGNOSIS — M545 Low back pain, unspecified: Secondary | ICD-10-CM

## 2018-01-18 DIAGNOSIS — N200 Calculus of kidney: Secondary | ICD-10-CM | POA: Diagnosis not present

## 2018-01-18 LAB — POCT URINALYSIS DIPSTICK
APPEARANCE: NORMAL
Bilirubin, UA: NEGATIVE
Glucose, UA: NEGATIVE
KETONES UA: NEGATIVE
LEUKOCYTES UA: NEGATIVE
Nitrite, UA: NEGATIVE
Protein, UA: NEGATIVE
Spec Grav, UA: 1.025 (ref 1.010–1.025)
Urobilinogen, UA: 0.2 E.U./dL
pH, UA: 5 (ref 5.0–8.0)

## 2018-01-18 MED ORDER — TIZANIDINE HCL 4 MG PO TABS: 4.0000 mg | ORAL_TABLET | Freq: Four times a day (QID) | ORAL | 0 refills | Status: DC | PRN

## 2018-01-18 NOTE — Progress Notes (Signed)
Name: Taylor Schwartz   MRN: [id number]    DOB: 1980/05/20   Date:01/18/2018       Progress Note  Subjective  Chief Complaint  Chief Complaint  Patient presents with  . Back Pain    lower back and kidney pain x 2 weeks. Pain is gradually worsening. Difficultity walking, legs give out. She has been taking Ibuprofent 600 mg for pain. She has a hx of kidney stones.    HPI  Pt presents with concern for lower back pain x2 weeks. Pain is worse with movement, she has slowly also developed LEFT flank pain that is worse after urination and is accompanied by nausea.  Does have known stone that was non-obstructive and noted in the [facility] 2019.  No dysuria or hematuria.  Did have an episode of sharp pain while lifting towels that caused her legs to feel weak yesterday.  Endorses nausea, no vomiting, numbness/tingling/radiation into the extremities.  Patient Active Problem List   Diagnosis Date Noted  . S/P laparoscopic hysterectomy 07/16/2017  . Endometriosis determined by laparoscopy 10/16/2016  . Diarrhea 07/04/2016  . Abdominal pain 07/04/2016  . Complex cyst of left ovary 07/04/2016  . Tobacco abuse 07/04/2016  . Depression with anxiety 10/17/2014    Social History   Tobacco Use  . Smoking status: Current Every Day Smoker    Packs/day: 0.50  . Smokeless tobacco: Never Used  Substance Use Topics  . Alcohol use: No     Current Outpatient Medications:  .  tiZANidine (ZANAFLEX) 4 MG tablet, Take 1 tablet (4 mg total) by mouth every 6 (six) hours as needed for muscle spasms., Disp: 30 tablet, Rfl: 0  Allergies  Allergen Reactions  . Cyclobenzaprine Itching  . Mobic [Meloxicam] Anaphylaxis  . Tramadol Hives  . Amoxicillin Hives and Itching    Has patient had a PCN reaction causing immediate rash, facial/tongue/throat swelling, SOB or lightheadedness with hypotension: Yes Has patient had a PCN reaction causing severe rash involving mucus membranes or skin necrosis: No Has  patient had a PCN reaction that required hospitalization: No Has patient had a PCN reaction occurring within the last 10 years: No If all of the above answers are "NO", then may proceed with Cephalosporin use.    Marland Kitchen Penicillins Hives, Itching and Rash    Has patient had a PCN reaction causing immediate rash, facial/tongue/throat swelling, SOB or lightheadedness with hypotension: Yes Has patient had a PCN reaction causing severe rash involving mucus membranes or skin necrosis: No Has patient had a PCN reaction that required hospitalization: No Has patient had a PCN reaction occurring within the last 10 years: No If all of the above answers are "NO", then may proceed with Cephalosporin use.     ROS  Ten systems reviewed and is negative except as mentioned in HPI.  Objective  Vitals:   01/18/18 0803  BP: 130/90  Pulse: 80  Resp: 16  Temp: 98.2 F (36.8 C)  TempSrc: Oral  SpO2: 99%  Weight: 136 lb 11.2 oz (62 kg)  Height: 5\' 6"  (1.676 m)   Body mass index is 22.06 kg/m.  Nursing Note and Vital Signs reviewed.  Physical Exam  Musculoskeletal:       Back:    Constitutional: Patient appears well-developed and well-nourished. No distress.  HENT: Head: Normocephalic and atraumatic. Ears: B TMs ok, no erythema or effusion; Nose: Nose normal. Mouth/Throat: Oropharynx is clear and moist. No oropharyngeal exudate.  Eyes: Conjunctivae and EOM are normal. Pupils are  equal, round, and reactive to light. No scleral icterus.  Neck: Normal range of motion. Neck supple. No JVD present. No thyromegaly present.  Cardiovascular: Normal rate, regular rhythm and normal heart sounds.  No murmur heard. No BLE edema. Pulmonary/Chest: Effort normal and breath sounds normal. No respiratory distress. Abdominal: Soft. Bowel sounds are normal, no distension. There is no tenderness. no masses. +CVA tenderness Musculoskeletal: Normal range of motion, no joint effusions. No gross deformities.  LEFT  lower back is significantly tender. Neurological: he is alert and oriented to person, place, and time. No cranial nerve deficit. Coordination, balance, strength, speech and gait are normal.  Skin: Skin is warm and dry. No rash noted. No erythema.  Psychiatric: Patient has a normal mood and affect. behavior is normal. Judgment and thought content normal.   Results for orders placed or performed in visit on 01/18/18 (from the past 72 hour(s))  POCT Urinalysis Dipstick     Status: Abnormal   Collection Time: 01/18/18  8:24 AM  Result Value Ref Range   Color, UA yellow    Clarity, UA clear    Glucose, UA Negative Negative   Bilirubin, UA Negative    Ketones, UA Negative    Spec Grav, UA 1.025 1.010 - 1.025   Blood, UA Small    pH, UA 5.0 5.0 - 8.0   Protein, UA Negative Negative   Urobilinogen, UA 0.2 0.2 or 1.0 E.U./dL   Nitrite, UA Negative    Leukocytes, UA Negative Negative   Appearance Normal     Assessment & Plan  1. Acute low back pain without sciatica, unspecified back pain laterality - POCT Urinalysis Dipstick - CT RENAL STONE STUDY; Future - STAT  2. Acute left-sided low back pain without sciatica - tiZANidine (ZANAFLEX) 4 MG tablet; Take 1 tablet (4 mg total) by mouth every 6 (six) hours as needed for muscle spasms.  Dispense: 30 tablet; Refill: 0  -Red flags and when to present for emergency care or RTC including fever >101.49F, chest pain, shortness of breath, new/worsening/un-resolving symptoms, saddle anesthesia, vomiting reviewed with patient at time of visit. Follow up and care instructions discussed and provided in AVS.    Strict ER precautions are discussed; offered to send to ER but pt declines, we will order stat CT renal stone study first, may consider either STAT Ortho referral vs Lumbar & Sacral Xrays if CT is negative for stone pathology.  Face-to-face time with patient was more than 25 minutes, >50% time spent counseling and coordination of care

## 2018-01-26 ENCOUNTER — Other Ambulatory Visit: Payer: Self-pay | Admitting: Family Medicine

## 2018-01-26 DIAGNOSIS — M545 Low back pain, unspecified: Secondary | ICD-10-CM

## 2018-01-27 NOTE — Telephone Encounter (Signed)
Pt requesting zanaflex refill - was given 30 tablets on 01/18/2018.  This medication is to be taken on an AS NEEDED basis, not meant to be taken around the clock.  If she is completely out, I am concerned that she is possible taking the medication too frequently.  Please ask her how her pain is doing.  Does she want to try physical therapy?  I am willing to provide one additional refill if needed, but it needs to last her 30 days.  If she feels she needs more frequent dosing, she will need to come in to see Dr. Sanda Klein (her PCP).

## 2018-01-28 NOTE — Telephone Encounter (Signed)
fyi

## 2018-01-28 NOTE — Telephone Encounter (Signed)
Patient states she still has 11 pills of ZANAFLEX. She states that the PA, Alexander at [facility] has her doing 2 excercies everyday and after those she is hurting really bad. She is having to do these at home due to her insurance (medicaid). She took the prednisone that the PA gave her. She said that she was also given HYDROCOD/ACTAM 5-325 and it makes her sick on her stomach. The PA tried to give her flexeril but she said she can not take that either. He told her to do those exercises for 6 weeks. She also states that if Raquel Sarna would like to get her xrays from him, that you can contact them and they will give them to you.

## 2018-01-29 NOTE — Telephone Encounter (Signed)
Pt.notified

## 2018-01-29 NOTE — Telephone Encounter (Signed)
If she is seeing ortho for these complaints, she needs to contact ortho

## 2018-05-07 ENCOUNTER — Ambulatory Visit: Payer: Medicaid Other | Admitting: Family Medicine

## 2018-05-07 ENCOUNTER — Encounter: Payer: Self-pay | Admitting: Family Medicine

## 2018-05-07 VITALS — BP 110/68 | HR 74 | Temp 97.8°F | Ht 66.75 in | Wt 140.0 lb

## 2018-05-07 DIAGNOSIS — Z0000 Encounter for general adult medical examination without abnormal findings: Secondary | ICD-10-CM | POA: Diagnosis not present

## 2018-05-07 DIAGNOSIS — Z85820 Personal history of malignant melanoma of skin: Secondary | ICD-10-CM

## 2018-05-07 DIAGNOSIS — R4589 Other symptoms and signs involving emotional state: Secondary | ICD-10-CM | POA: Diagnosis not present

## 2018-05-07 DIAGNOSIS — R5383 Other fatigue: Secondary | ICD-10-CM | POA: Diagnosis not present

## 2018-05-07 DIAGNOSIS — M549 Dorsalgia, unspecified: Secondary | ICD-10-CM

## 2018-05-07 DIAGNOSIS — F418 Other specified anxiety disorders: Secondary | ICD-10-CM

## 2018-05-07 DIAGNOSIS — R1013 Epigastric pain: Secondary | ICD-10-CM | POA: Diagnosis not present

## 2018-05-07 DIAGNOSIS — Z720 Tobacco use: Secondary | ICD-10-CM

## 2018-05-07 DIAGNOSIS — R9389 Abnormal findings on diagnostic imaging of other specified body structures: Secondary | ICD-10-CM

## 2018-05-07 NOTE — Assessment & Plan Note (Signed)
Urged patient to quit smoking

## 2018-05-07 NOTE — Assessment & Plan Note (Signed)
Patient concerned about location, where melanoma was; here for routine physical; will get labs today, [place] and close f/u next week; will proceed with CT scan if US unremarkable

## 2018-05-07 NOTE — Assessment & Plan Note (Signed)
Patient denies SI/HI; encouraged her to return next week for further discussion, treatment options; get labs today including TSH, CBC (to r/o anemia), vit B12, and vit D

## 2018-05-07 NOTE — Patient Instructions (Addendum)
Return for a problem based visit next week to talk more about your anxiety and depression and sugar craving and difficulty losing weight, etc We'll get an ultrasound of your abdomen We'll have you see the dermatologist If you have not heard anything from my staff in a week about any orders/referrals/studies from today, please contact us here to follow-up [phone]  Health Maintenance, Female Adopting a healthy lifestyle and getting preventive care can go a long way to promote health and wellness. Talk with your health care provider about what schedule of regular examinations is right for you. This is a good chance for you to check in with your provider about disease prevention and staying healthy. In between checkups, there are plenty of things you can do on your own. Experts have done a lot of research about which lifestyle changes and preventive measures are most likely to keep you healthy. Ask your health care provider for more information. Weight and diet Eat a healthy diet  Be sure to include plenty of vegetables, fruits, low-fat dairy products, and lean protein.  Do not eat a lot of foods high in solid fats, added sugars, or salt.  Get regular exercise. This is one of the most important things you can do for your health. ? Most adults should exercise for at least 150 minutes each week. The exercise should increase your heart rate and make you sweat (moderate-intensity exercise). ? Most adults should also do strengthening exercises at least twice a week. This is in addition to the moderate-intensity exercise. Maintain a healthy weight  Body mass index (BMI) is a measurement that can be used to identify possible weight problems. It estimates body fat based on height and weight. Your health care provider can help determine your BMI and help you achieve or maintain a healthy weight.  For females 6 years of age and older: ? A BMI below 18.5 is considered underweight. ? A BMI of 18.5 to  24.9 is normal. ? A BMI of 25 to 29.9 is considered overweight. ? A BMI of 30 and above is considered obese. Watch levels of cholesterol and blood lipids  You should start having your blood tested for lipids and cholesterol at 35 years of age, then have this test every 5 years.  You may need to have your cholesterol levels checked more often if: ? Your lipid or cholesterol levels are high. ? You are older than 35 years of age. ? You are at high risk for heart disease. Cancer screening Lung Cancer  Lung cancer screening is recommended for adults 62-72 years old who are at high risk for lung cancer because of a history of smoking.  A yearly low-dose CT scan of the lungs is recommended for people who: ? Currently smoke. ? Have quit within the past 15 years. ? Have at least a 30-pack-year history of smoking. A pack year is smoking an average of one pack of cigarettes a day for 1 year.  Yearly screening should continue until it has been 15 years since you quit.  Yearly screening should stop if you develop a health problem that would prevent you from having lung cancer treatment. Breast Cancer  Practice breast self-awareness. This means understanding how your breasts normally appear and feel.  It also means doing regular breast self-exams. Let your health care provider know about any changes, no matter how small.  If you are in your 20s or 30s, you should have a clinical breast exam (CBE) by a health  care provider every 1-3 years as part of a regular health exam.  If you are 39 or older, have a CBE every year. Also consider having a breast X-ray (mammogram) every year.  If you have a family history of breast cancer, talk to your health care provider about genetic screening.  If you are at high risk for breast cancer, talk to your health care provider about having an MRI and a mammogram every year.  Breast cancer gene (BRCA) assessment is recommended for women who have family members  with BRCA-related cancers. BRCA-related cancers include: ? Breast. ? Ovarian. ? Tubal. ? Peritoneal cancers.  Results of the assessment will determine the need for genetic counseling and BRCA1 and BRCA2 testing. Cervical Cancer Your health care provider may recommend that you be screened regularly for cancer of the pelvic organs (ovaries, uterus, and vagina). This screening involves a pelvic examination, including checking for microscopic changes to the surface of your cervix (Pap test). You may be encouraged to have this screening done every 3 years, beginning at age 69.  For women ages 53-65, health care providers may recommend pelvic exams and Pap testing every 3 years, or they may recommend the Pap and pelvic exam, combined with testing for human papilloma virus (HPV), every 5 years. Some types of HPV increase your risk of cervical cancer. Testing for HPV may also be done on women of any age with unclear Pap test results.  Other health care providers may not recommend any screening for nonpregnant women who are considered low risk for pelvic cancer and who do not have symptoms. Ask your health care provider if a screening pelvic exam is right for you.  If you have had past treatment for cervical cancer or a condition that could lead to cancer, you need Pap tests and screening for cancer for at least 20 years after your treatment. If Pap tests have been discontinued, your risk factors (such as having a new sexual partner) need to be reassessed to determine if screening should resume. Some women have medical problems that increase the chance of getting cervical cancer. In these cases, your health care provider may recommend more frequent screening and Pap tests. Colorectal Cancer  This type of cancer can be detected and often prevented.  Routine colorectal cancer screening usually begins at 35 years of age and continues through 35 years of age.  Your health care provider may recommend  screening at an earlier age if you have risk factors for colon cancer.  Your health care provider may also recommend using home test kits to check for hidden blood in the stool.  A small camera at the end of a tube can be used to examine your colon directly (sigmoidoscopy or colonoscopy). This is done to check for the earliest forms of colorectal cancer.  Routine screening usually begins at age 61.  Direct examination of the colon should be repeated every 5-10 years through 35 years of age. However, you may need to be screened more often if early forms of precancerous polyps or small growths are found. Skin Cancer  Check your skin from head to toe regularly.  Tell your health care provider about any new moles or changes in moles, especially if there is a change in a mole's shape or color.  Also tell your health care provider if you have a mole that is larger than the size of a pencil eraser.  Always use sunscreen. Apply sunscreen liberally and repeatedly throughout the day.  Protect  yourself by wearing long sleeves, pants, a wide-brimmed hat, and sunglasses whenever you are outside. Heart disease, diabetes, and high blood pressure  High blood pressure causes heart disease and increases the risk of stroke. High blood pressure is more likely to develop in: ? People who have blood pressure in the high end of the normal range (130-139/85-89 mm Hg). ? People who are overweight or obese. ? People who are African American.  If you are 92-25 years of age, have your blood pressure checked every 3-5 years. If you are 71 years of age or older, have your blood pressure checked every year. You should have your blood pressure measured twice-once when you are at a hospital or clinic, and once when you are not at a hospital or clinic. Record the average of the two measurements. To check your blood pressure when you are not at a hospital or clinic, you can use: ? An automated blood pressure machine at a  pharmacy. ? A home blood pressure monitor.  If you are between 22 years and 23 years old, ask your health care provider if you should take aspirin to prevent strokes.  Have regular diabetes screenings. This involves taking a blood sample to check your fasting blood sugar level. ? If you are at a normal weight and have a low risk for diabetes, have this test once every three years after 35 years of age. ? If you are overweight and have a high risk for diabetes, consider being tested at a younger age or more often. Preventing infection Hepatitis B  If you have a higher risk for hepatitis B, you should be screened for this virus. You are considered at high risk for hepatitis B if: ? You were born in a country where hepatitis B is common. Ask your health care provider which countries are considered high risk. ? Your parents were born in a high-risk country, and you have not been immunized against hepatitis B (hepatitis B vaccine). ? You have HIV or AIDS. ? You use needles to inject street drugs. ? You live with someone who has hepatitis B. ? You have had sex with someone who has hepatitis B. ? You get hemodialysis treatment. ? You take certain medicines for conditions, including cancer, organ transplantation, and autoimmune conditions. Hepatitis C  Blood testing is recommended for: ? Everyone born from [birth year] through 1963. ? Anyone with known risk factors for hepatitis C. Sexually transmitted infections (STIs)  You should be screened for sexually transmitted infections (STIs) including gonorrhea and chlamydia if: ? You are sexually active and are younger than 35 years of age. ? You are older than 35 years of age and your health care provider tells you that you are at risk for this type of infection. ? Your sexual activity has changed since you were last screened and you are at an increased risk for chlamydia or gonorrhea. Ask your health care provider if you are at risk.  If you do not have  HIV, but are at risk, it may be recommended that you take a prescription medicine daily to prevent HIV infection. This is called pre-exposure prophylaxis (PrEP). You are considered at risk if: ? You are sexually active and do not regularly use condoms or know the HIV status of your partner(s). ? You take drugs by injection. ? You are sexually active with a partner who has HIV. Talk with your health care provider about whether you are at high risk of being infected with HIV. If  you choose to begin PrEP, you should first be tested for HIV. You should then be tested every 3 months for as long as you are taking PrEP. Pregnancy  If you are premenopausal and you may become pregnant, ask your health care provider about preconception counseling.  If you may become pregnant, take 400 to 800 micrograms (mcg) of folic acid every day.  If you want to prevent pregnancy, talk to your health care provider about birth control (contraception). Osteoporosis and menopause  Osteoporosis is a disease in which the bones lose minerals and strength with aging. This can result in serious bone fractures. Your risk for osteoporosis can be identified using a bone density scan.  If you are 68 years of age or older, or if you are at risk for osteoporosis and fractures, ask your health care provider if you should be screened.  Ask your health care provider whether you should take a calcium or vitamin D supplement to lower your risk for osteoporosis.  Menopause may have certain physical symptoms and risks.  Hormone replacement therapy may reduce some of these symptoms and risks. Talk to your health care provider about whether hormone replacement therapy is right for you. Follow these instructions at home:  Schedule regular health, dental, and eye exams.  Stay current with your immunizations.  Do not use any tobacco products including cigarettes, chewing tobacco, or electronic cigarettes.  If you are pregnant, do not  drink alcohol.  If you are breastfeeding, limit how much and how often you drink alcohol.  Limit alcohol intake to no more than 1 drink per day for nonpregnant women. One drink equals 12 ounces of beer, 5 ounces of wine, or 1 ounces of hard liquor.  Do not use street drugs.  Do not share needles.  Ask your health care provider for help if you need support or information about quitting drugs.  Tell your health care provider if you often feel depressed.  Tell your health care provider if you have ever been abused or do not feel safe at home. This information is not intended to replace advice given to you by your health care provider. Make sure you discuss any questions you have with your health care provider. Document Released: 11/11/2010 Document Revised: 10/04/2015 Document Reviewed: 01/30/2015 [employer] Interactive Patient Education  2019 [employer].

## 2018-05-07 NOTE — Progress Notes (Signed)
BP 110/68   Pulse 74   Temp 97.8 F (36.6 C)   Ht 5' 6.75" (1.695 m)   Wt 140 lb (63.5 kg)   LMP 06/30/2017 (Approximate)   SpO2 99%   BMI 22.09 kg/m    Subjective:    Patient ID: Taylor Schwartz, female    DOB: 06-12-1980, 37 y.o.   MRN: [id number]  HPI: Taylor Schwartz is a 37 y.o. female  Chief Complaint  Patient presents with  . Annual Exam     HPI  Patient is here for a physical, but has several other issues  Craving sugar all the time; feeling tired; anxious; back issues; depression; went to ortho for back issues, disc degeneration and nerve impingement and scoliosis; Taylor Schwartz as copy of xrays (copy made today); doctor at ortho told her about worrisome spots on her xray in her abdomen but Taylor Schwartz is having abd pain around the area where her melanoma was; Taylor Schwartz just had a CT scan (renal stone study) in September; no acute abnormality, punctate bilateral nephrolithiasis, no hydronephrosis, no ureteral or bladder stones per report; pancreas at that time was normal, with no mass or duct dilataion; liver was normal without mass; s/p choly; no LAD, s/p hyst, no aggressive MSK lesions; Taylor Schwartz also mentions that when Taylor Schwartz leans her head forward, her nose runs  USPSTF grade A and B recommendations Depression:  Depression screen [facility] 2/9 05/07/2018 01/18/2018 07/04/2016  Decreased Interest 0 0 0  Down, Depressed, Hopeless 0 0 1  PHQ - 2 Score 0 0 1  Altered sleeping 3 - -  Tired, decreased energy 3 - -  Change in appetite 3 - -  Feeling bad or failure about yourself  0 - -  Trouble concentrating 3 - -  Moving slowly or fidgety/restless 0 - -  Suicidal thoughts 0 - -  PHQ-9 Score 12 - -  Difficult doing work/chores Somewhat difficult - -   Hypertension: BP Readings from Last 3 Encounters:  05/07/18 110/68  01/18/18 130/90  08/26/17 (!) 98/50   Obesity: Wt Readings from Last 3 Encounters:  05/07/18 140 lb (63.5 kg)  01/18/18 136 lb 11.2 oz (62 kg)  08/26/17 136 lb (61.7 kg)   BMI  Readings from Last 3 Encounters:  05/07/18 22.09 kg/m  01/18/18 22.06 kg/m  08/26/17 21.95 kg/m    Skin cancer: "one of the main reasons I came today" Taylor Schwartz states, when I asked about skin cancer; several years ago, they removed something that looked like melanoma, then Taylor Schwartz said it was melanoma; then they had to go and cut more out; now that spot itches and Taylor Schwartz is having pains; it was removed from the abdomen and Taylor Schwartz is having itching across the abdomen; mother had skin cancer and osteoporosis and father had testicular cancer Lung cancer: smoking almost 1 ppd; started age 38, now age 19, 28 pack years; Taylor Schwartz stays anxious and on edge all the time; lives with and cares for mother in law, son is autistic, lot on her plate, full time student Breast cancer: "they found something and did a biopsy"; hx of LEFT breast biopsy, came back as benign (2 years ago); Taylor Schwartz was told to wait until age 41 for next mammo, everything looked good; no mammogram since Colorectal cancer: no blood in the stool, no fam hx of colon cancer Cervical cancer screening: UTD; Dr. Georgianne Fick left one ovary, "he took everything else out" BRCA gene screening: family hx of breast and/or ovarian cancer and/or metastatic  prostate cancer? no HIV, hep B, hep C: not desired STD testing and prevention (chl/gon/syphilis): not desired; one partner for 19 years Intimate partner violence: no abuse Contraception: hyst Osteoporosis: n/a Fall prevention/vitamin D: discussed Immunizations: tetanus UTD: declined flu shot  Diet: gaining weight, only one meal a day; no energy; goes nonstop but not losing weight Exercise: walks 3 miles a day five days a week, "vent walks"; 15k steps a day Alcohol:    Office Visit from 05/07/2018 in [facility]  AUDIT-C Score  0     Tobacco use: current smoker AAA: n/a Aspirin: n/a Glucose:  Craving glucose; truly fasting right now Glucose, Bld  Date Value Ref Range Status  07/17/2017 147  (H) 65 - 99 mg/dL Final  07/10/2017 80 65 - 99 mg/dL Final  07/01/2016 97 65 - 99 mg/dL Final   Lipids: check today No results found for: CHOL No results found for: HDL No results found for: LDLCALC No results found for: TRIG No results found for: CHOLHDL No results found for: LDLDIRECT   Depression screen [facility] 2/9 05/07/2018 01/18/2018 07/04/2016  Decreased Interest 0 0 0  Down, Depressed, Hopeless 0 0 1  PHQ - 2 Score 0 0 1  Altered sleeping 3 - -  Tired, decreased energy 3 - -  Change in appetite 3 - -  Feeling bad or failure about yourself  0 - -  Trouble concentrating 3 - -  Moving slowly or fidgety/restless 0 - -  Suicidal thoughts 0 - -  PHQ-9 Score 12 - -  Difficult doing work/chores Somewhat difficult - -   Fall Risk  05/07/2018 01/18/2018 07/04/2016  Falls in the past year? 0 No No    Relevant past medical, surgical, family and social history reviewed Past Medical History:  Diagnosis Date  . Abnormal mammogram 05/2016  . Anxiety   . Complex cyst of left ovary 07/04/2016  . Endometriosis determined by laparoscopy 2018  . History of kidney stones 06/2017  . PONV (postoperative nausea and vomiting)   . Tobacco abuse 07/04/2016   Past Surgical History:  Procedure Laterality Date  . BREAST SURGERY Left 05/2016   benign. metal clip to identify area when mammograms  . CHOLECYSTECTOMY  2008  . CYSTOSCOPY N/A 07/16/2017   Procedure: CYSTOSCOPY;  Surgeon: Malachy Mood, MD;  Location: [facility] ORS;  Service: Gynecology;  Laterality: N/A;  . LAPAROSCOPIC HYSTERECTOMY Bilateral 07/16/2017   Procedure: HYSTERECTOMY TOTAL LAPAROSCOPIC,LEFT SALPINGO OPHORECTOMY,RIGHT SALPINGECTOMY;  Surgeon: Malachy Mood, MD;  Location: [facility] ORS;  Service: Gynecology;  Laterality: Bilateral;  . LAPAROSCOPIC OVARIAN CYSTECTOMY Left 10/09/2016   Procedure: LAPAROSCOPIC OVARIAN CYSTECTOMY;  Surgeon: Malachy Mood, MD;  Location: [facility] ORS;  Service: Gynecology;  Laterality: Left;  . mole  removed  2016   area under breast removed  . nsvd  2007   Family History  Problem Relation Age of Onset  . Lupus Mother   . Arthritis Mother   . Glaucoma Mother   . Vision loss Mother   . Diabetes Mother   . Ovarian cancer Mother 36  . Diabetes Father   . Prostate cancer Father 32  . Diabetes Maternal Grandmother   . Dementia Maternal Grandmother   . Diabetes Maternal Grandfather   . Pneumonia Maternal Grandfather   . Diabetes Paternal Grandmother   . Diabetes Paternal Grandfather    Social History   Tobacco Use  . Smoking status: Current Every Day Smoker    Packs/day: 0.50  . Smokeless tobacco: Never  Used  Substance Use Topics  . Alcohol use: No  . Drug use: No     Office Visit from 05/07/2018 in [facility]  AUDIT-C Score  0      Interim medical history since last visit reviewed. Allergies and medications reviewed  Review of Systems  Constitutional: Positive for fatigue and unexpected weight change.  HENT: Positive for rhinorrhea (clear fluid) and sore throat (little bit but from allergies Taylor Schwartz thinks). Negative for nosebleeds.   Eyes: Positive for visual disturbance (just in the morning, if standing up really quick).  Cardiovascular: Negative for chest pain.  Gastrointestinal: Positive for abdominal pain. Negative for blood in stool.  Genitourinary: Negative for hematuria.  Musculoskeletal: Positive for back pain.  Skin:       Itching around the site of previous melanoma (excised 6-7 years ago); changes in color of other moles  Neurological: Negative for tremors.  Hematological: Does not bruise/bleed easily.  Psychiatric/Behavioral: Positive for sleep disturbance.   Per HPI unless specifically indicated above     Objective:    BP 110/68   Pulse 74   Temp 97.8 F (36.6 C)   Ht 5' 6.75" (1.695 m)   Wt 140 lb (63.5 kg)   LMP 06/30/2017 (Approximate)   SpO2 99%   BMI 22.09 kg/m   Wt Readings from Last 3 Encounters:  05/07/18 140  lb (63.5 kg)  01/18/18 136 lb 11.2 oz (62 kg)  08/26/17 136 lb (61.7 kg)    Physical Exam Constitutional:      Appearance: Normal appearance. Taylor Schwartz is well-developed.  HENT:     Head: Normocephalic and atraumatic.     Right Ear: Hearing, tympanic membrane, ear canal and external ear normal.     Left Ear: Hearing, tympanic membrane, ear canal and external ear normal.  Eyes:     General: No scleral icterus.       Right eye: No hordeolum.        Left eye: No hordeolum.     Conjunctiva/sclera: Conjunctivae normal.  Neck:     Thyroid: No thyromegaly.  Cardiovascular:     Rate and Rhythm: Normal rate and regular rhythm.  No extrasystoles are present.    Heart sounds: Normal heart sounds, S1 normal and S2 normal.  Pulmonary:     Effort: Pulmonary effort is normal. No respiratory distress.     Breath sounds: Normal breath sounds.  Chest:     Breasts: Breasts are symmetrical.        Right: No inverted nipple, mass, nipple discharge, skin change or tenderness.        Left: No inverted nipple, mass, nipple discharge, skin change or tenderness.  Abdominal:     General: Bowel sounds are normal. There is no distension or abdominal bruit.     Palpations: Abdomen is soft. There is no hepatomegaly, mass or pulsatile mass.     Tenderness: There is abdominal tenderness in the epigastric area. There is no guarding.     Hernia: No hernia is present.       Comments: Tender in the epigastrium near her surgical scar  Musculoskeletal: Normal range of motion.  Lymphadenopathy:     Head:     Right side of head: No submandibular adenopathy.     Left side of head: No submandibular adenopathy.     Cervical: No cervical adenopathy.  Skin:    General: Skin is warm and dry.     Coloration: Skin is not pale.  Findings: No bruising or ecchymosis.  Neurological:     Mental Status: Taylor Schwartz is alert.     Cranial Nerves: No cranial nerve deficit.     Motor: No tremor or abnormal muscle tone.     Gait: Gait  normal.     Deep Tendon Reflexes:     Reflex Scores:      Patellar reflexes are 2+ on the right side and 2+ on the left side. Psychiatric:        Mood and Affect: Mood is not anxious or depressed.        Speech: Speech normal.        Behavior: Behavior normal.        Thought Content: Thought content normal.       Assessment & Plan:   Problem List Items Addressed This Visit      Other   Tobacco abuse    Urged patient to quit smoking      Hx of malignant melanoma   Relevant Orders   Ambulatory referral to Dermatology   US Abdomen Complete   Depression with anxiety (Chronic)    Patient denies SI/HI; encouraged her to return next week for further discussion, treatment options; get labs today including TSH, CBC (to r/o anemia), vit B12, and vit D      Abdominal pain    Patient concerned about location, where melanoma was; here for routine physical; will get labs today, [place] and close f/u next week; will proceed with CT scan if US unremarkable      Relevant Orders   US Abdomen Complete    Other Visit Diagnoses    Preventative health care    -  Primary   Relevant Orders   CBC with Differential/Platelet   COMPLETE METABOLIC PANEL WITH GFR   Lipid panel   TSH   Other fatigue       checking labs today and bringing her back next week for further discussion, work-up   Relevant Orders   VITAMIN D 25 Hydroxy (Vit-D Deficiency, Fractures)   Vitamin B12   US Abdomen Complete   Feeling anxious       will see her back next week for further discussion; Taylor Schwartz denies SI/HI   Back pain, unspecified back location, unspecified back pain laterality, unspecified chronicity       recently evaluated by [facility] (Sept), imaging done; xray was read as "abnormal" and to share with PCP; request records   Abnormal finding on imaging       patient brought in xrays reported as "abnormal" by ortho and instructed to f/u with PCP (images were more than 3 months old); [place] ordered, close f/u in 1 weeks         Follow up plan: Return in about 1 year (around 05/08/2019) for complete physical; one week for other issues.  An after-visit summary was printed and given to the patient at [place].  Please see the patient instructions which may contain other information and recommendations beyond what is mentioned above in the assessment and plan.  No orders of the defined types were placed in this encounter.   Orders Placed This Encounter  Procedures  . US Abdomen Complete  . CBC with Differential/Platelet  . COMPLETE METABOLIC PANEL WITH GFR  . Lipid panel  . TSH  . VITAMIN D 25 Hydroxy (Vit-D Deficiency, Fractures)  . Vitamin B12  . Ambulatory referral to Dermatology

## 2018-05-08 LAB — CBC WITH DIFFERENTIAL/PLATELET
ABSOLUTE MONOCYTES: 647 {cells}/uL (ref 200–950)
BASOS PCT: 0.7 %
Basophils Absolute: 59 cells/uL (ref 0–200)
EOS ABS: 160 {cells}/uL (ref 15–500)
Eosinophils Relative: 1.9 %
HCT: 39 % (ref 35.0–45.0)
HEMOGLOBIN: 13.2 g/dL (ref 11.7–15.5)
Lymphs Abs: 2302 cells/uL (ref 850–3900)
MCH: 29.8 pg (ref 27.0–33.0)
MCHC: 33.8 g/dL (ref 32.0–36.0)
MCV: 88 fL (ref 80.0–100.0)
MPV: 10.3 fL (ref 7.5–12.5)
Monocytes Relative: 7.7 %
NEUTROS ABS: 5233 {cells}/uL (ref 1500–7800)
Neutrophils Relative %: 62.3 %
PLATELETS: 288 10*3/uL (ref 140–400)
RBC: 4.43 10*6/uL (ref 3.80–5.10)
RDW: 12.6 % (ref 11.0–15.0)
TOTAL LYMPHOCYTE: 27.4 %
WBC: 8.4 10*3/uL (ref 3.8–10.8)

## 2018-05-08 LAB — LIPID PANEL
Cholesterol: 162 mg/dL (ref <200)
HDL: 54 mg/dL (ref >50)
LDL CHOLESTEROL (CALC): 88 mg/dL
Non-HDL Cholesterol (Calc): 108 mg/dL (calc) (ref <130)
TRIGLYCERIDES: 100 mg/dL (ref <150)
Total CHOL/HDL Ratio: 3 (calc) (ref <5.0)

## 2018-05-08 LAB — COMPLETE METABOLIC PANEL WITH GFR
AG RATIO: 1.7 (calc) (ref 1.0–2.5)
ALBUMIN MSPROF: 4.7 g/dL (ref 3.6–5.1)
ALT: 13 U/L (ref 6–29)
AST: 21 U/L (ref 10–30)
Alkaline phosphatase (APISO): 39 U/L (ref 33–115)
BUN: 15 mg/dL (ref 7–25)
CALCIUM: 10.2 mg/dL (ref 8.6–10.2)
CO2: 26 mmol/L (ref 20–32)
Chloride: 106 mmol/L (ref 98–110)
Creat: 0.77 mg/dL (ref 0.50–1.10)
GFR, EST AFRICAN AMERICAN: 116 mL/min/{1.73_m2} (ref >=60)
GFR, EST NON AFRICAN AMERICAN: 100 mL/min/{1.73_m2} (ref >=60)
GLUCOSE: 76 mg/dL (ref 65–99)
Globulin: 2.7 g/dL (calc) (ref 1.9–3.7)
Potassium: 4.2 mmol/L (ref 3.5–5.3)
Sodium: 139 mmol/L (ref 135–146)
TOTAL PROTEIN: 7.4 g/dL (ref 6.1–8.1)
Total Bilirubin: 0.6 mg/dL (ref 0.2–1.2)

## 2018-05-08 LAB — VITAMIN B12: VITAMIN B 12: 375 pg/mL (ref 200–1100)

## 2018-05-08 LAB — VITAMIN D 25 HYDROXY (VIT D DEFICIENCY, FRACTURES): VIT D 25 HYDROXY: 61 ng/mL (ref 30–100)

## 2018-05-08 LAB — TSH: TSH: 0.67 m[IU]/L

## 2018-05-13 ENCOUNTER — Ambulatory Visit: Payer: Medicaid Other | Admitting: Family Medicine

## 2018-05-13 ENCOUNTER — Encounter: Payer: Self-pay | Admitting: Family Medicine

## 2018-05-13 VITALS — BP 108/62 | HR 89 | Temp 99.4°F | Ht 67.0 in | Wt 138.1 lb

## 2018-05-13 DIAGNOSIS — F419 Anxiety disorder, unspecified: Secondary | ICD-10-CM | POA: Diagnosis not present

## 2018-05-13 DIAGNOSIS — J029 Acute pharyngitis, unspecified: Secondary | ICD-10-CM | POA: Diagnosis not present

## 2018-05-13 DIAGNOSIS — Z72820 Sleep deprivation: Secondary | ICD-10-CM

## 2018-05-13 LAB — POCT RAPID STREP A (OFFICE): Rapid Strep A Screen: NEGATIVE

## 2018-05-13 MED ORDER — SERTRALINE HCL 50 MG PO TABS: ORAL_TABLET | ORAL | 0 refills | Status: DC

## 2018-05-13 NOTE — Assessment & Plan Note (Signed)
Refer to therapist for CBT; get the TV out of the bedroom

## 2018-05-13 NOTE — Patient Instructions (Addendum)
Please get the TV out of the bedroom Put the phone in a drawer but otherwise, do not play on it during bedtime hours We'll have you see the therapist Please let me know right away if the medicine makes you worse Seek medical attention if lack of sleep, thoughts of paranoia or grandiosity, strange thoughts I'll suggest some mild increase in your calcium intake  12 Ways to Curb Anxiety  ?Anxiety is normal human sensation. It is what helped our ancestors survive the pitfalls of the wilderness. Anxiety is defined as experiencing worry or nervousness about an imminent event or something with an uncertain outcome. It is a feeling experienced by most people at some point in their lives. Anxiety can be triggered by a very personal issue, such as the illness of a loved one, or an event of global proportions, such as a refugee crisis. Some of the symptoms of anxiety are:  Feeling restless.  Having a feeling of impending danger.  Increased heart rate.  Rapid breathing. Sweating.  Shaking.  Weakness or feeling tired.  Difficulty concentrating on anything except the current worry.  Insomnia.  Stomach or bowel problems. What can we do about anxiety we may be feeling? There are many techniques to help manage stress and relax. Here are 12 ways you can reduce your anxiety almost immediately: 1. Turn off the constant feed of information. Take a social media sabbatical. Studies have shown that social media directly contributes to social anxiety.  2. Monitor your television viewing habits. Are you watching shows that are also contributing to your anxiety, such as 24-hour news stations? Try watching something else, or better yet, nothing at all. Instead, listen to music, read an inspirational book or practice a hobby. 3. Eat nutritious meals. Also, don't skip meals and keep healthful snacks on hand. Hunger and poor diet contributes to feeling anxious. 4. Sleep. Sleeping on a regular schedule for at least seven to  eight hours a night will do wonders for your outlook when you are awake. 5. Exercise. Regular exercise will help rid your body of that anxious energy and help you get more restful sleep. 6. Try deep (diaphragmatic) breathing. Inhale slowly through your nose for five seconds and exhale through your mouth. 7. Practice acceptance and gratitude. When anxiety hits, accept that there are things out of your control that shouldn't be of immediate concern.  8. Seek out humor. When anxiety strikes, watch a funny video, read jokes or call a friend who makes you laugh. Laughter is healing for our bodies and releases endorphins that are calming. 9. Stay positive. Take the effort to replace negative thoughts with positive ones. Try to see a stressful situation in a positive light. Try to come up with solutions rather than dwelling on the problem. 10. Figure out what triggers your anxiety. Keep a journal and make note of anxious moments and the events surrounding them. This will help you identify triggers you can avoid or even eliminate. 11. Talk to someone. Let a trusted friend, family member or even trained professional know that you are feeling overwhelmed and anxious. Verbalize what you are feeling and why.  12. Volunteer. If your anxiety is triggered by a crisis on a large scale, become an advocate and work to resolve the problem that is causing you unease. Anxiety is often unwelcome and can become overwhelming. If not kept in check, it can become a disorder that could require medical treatment. However, if you take the time to care for yourself  and avoid the triggers that make you anxious, you will be able to find moments of relaxation and clarity that make your life much more enjoyable.    Insomnia Insomnia is a sleep disorder that makes it difficult to fall asleep or stay asleep. Insomnia can cause fatigue, low energy, difficulty concentrating, mood swings, and poor performance at work or school. There are  three different ways to classify insomnia:  Difficulty falling asleep.  Difficulty staying asleep.  Waking up too early in the morning. Any type of insomnia can be long-term (chronic) or short-term (acute). Both are common. Short-term insomnia usually lasts for three months or less. Chronic insomnia occurs at least three times a week for longer than three months. What are the causes? Insomnia may be caused by another condition, situation, or substance, such as:  Anxiety.  Certain medicines.  Gastroesophageal reflux disease (GERD) or other gastrointestinal conditions.  Asthma or other breathing conditions.  Restless legs syndrome, sleep apnea, or other sleep disorders.  Chronic pain.  Menopause.  Stroke.  Abuse of alcohol, tobacco, or illegal drugs.  Mental health conditions, such as depression.  Caffeine.  Neurological disorders, such as Alzheimer's disease.  An overactive thyroid (hyperthyroidism). Sometimes, the cause of insomnia may not be known. What increases the risk? Risk factors for insomnia include:  Gender. Women are affected more often than men.  Age. Insomnia is more common as you get older.  Stress.  Lack of exercise.  Irregular work schedule or working night shifts.  Traveling between different time zones.  Certain medical and mental health conditions. What are the signs or symptoms? If you have insomnia, the main symptom is having trouble falling asleep or having trouble staying asleep. This may lead to other symptoms, such as:  Feeling fatigued or having low energy.  Feeling nervous about going to sleep.  Not feeling rested in the morning.  Having trouble concentrating.  Feeling irritable, anxious, or depressed. How is this diagnosed? This condition may be diagnosed based on:  Your symptoms and medical history. Your health care provider may ask about: ? Your sleep habits. ? Any medical conditions you have. ? Your mental  health.  A physical exam. How is this treated? Treatment for insomnia depends on the cause. Treatment may focus on treating an underlying condition that is causing insomnia. Treatment may also include:  Medicines to help you sleep.  Counseling or therapy.  Lifestyle adjustments to help you sleep better. Follow these instructions at home: Eating and drinking   Limit or avoid alcohol, caffeinated beverages, and cigarettes, especially close to bedtime. These can disrupt your sleep.  Do not eat a large meal or eat spicy foods right before bedtime. This can lead to digestive discomfort that can make it hard for you to sleep. Sleep habits   Keep a sleep diary to help you and your health care provider figure out what could be causing your insomnia. Write down: ? When you sleep. ? When you wake up during the night. ? How well you sleep. ? How rested you feel the next day. ? Any side effects of medicines you are taking. ? What you eat and drink.  Make your bedroom a dark, comfortable place where it is easy to fall asleep. ? Put up shades or blackout curtains to block light from outside. ? Use a white noise machine to block noise. ? Keep the temperature cool.  Limit screen use before bedtime. This includes: ? Watching TV. ? Using your smartphone, tablet, or  computer.  Stick to a routine that includes going to bed and waking up at the same times every day and night. This can help you fall asleep faster. Consider making a quiet activity, such as reading, part of your nighttime routine.  Try to avoid taking naps during the day so that you sleep better at night.  Get out of bed if you are still awake after 15 minutes of trying to sleep. Keep the lights down, but try reading or doing a quiet activity. When you feel sleepy, go back to bed. General instructions  Take over-the-counter and prescription medicines only as told by your health care provider.  Exercise regularly, as told by  your health care provider. Avoid exercise starting several hours before bedtime.  Use relaxation techniques to manage stress. Ask your health care provider to suggest some techniques that may work well for you. These may include: ? Breathing exercises. ? Routines to release muscle tension. ? Visualizing peaceful scenes.  Make sure that you drive carefully. Avoid driving if you feel very sleepy.  Keep all follow-up visits as told by your health care provider. This is important. Contact a health care provider if:  You are tired throughout the day.  You have trouble in your daily routine due to sleepiness.  You continue to have sleep problems, or your sleep problems get worse. Get help right away if:  You have serious thoughts about hurting yourself or someone else. If you ever feel like you may hurt yourself or others, or have thoughts about taking your own life, get help right away. You can go to your nearest emergency department or call:  Your local emergency services (911 in the [place]  A suicide crisis helpline, such as the [place] at 251-079-6742. This is open 24 hours a day. Summary  Insomnia is a sleep disorder that makes it difficult to fall asleep or stay asleep.  Insomnia can be long-term (chronic) or short-term (acute).  Treatment for insomnia depends on the cause. Treatment may focus on treating an underlying condition that is causing insomnia.  Keep a sleep diary to help you and your health care provider figure out what could be causing your insomnia. This information is not intended to replace advice given to you by your health care provider. Make sure you discuss any questions you have with your health care provider. Document Released: 04/25/2000 Document Revised: 02/05/2017 Document Reviewed: 02/05/2017 Elsevier Interactive Patient Education  2019 [employer].

## 2018-05-13 NOTE — Assessment & Plan Note (Signed)
Refer to therapist for CBT

## 2018-05-13 NOTE — Progress Notes (Signed)
BP 108/62   Pulse 89   Temp 99.4 F (37.4 C) (Oral)   Ht 5\' 7"  (1.702 m)   Wt 138 lb 1.6 oz (62.6 kg)   LMP 06/30/2017 (Approximate)   SpO2 99%   BMI 21.63 kg/m    Subjective:    Patient ID: Taylor Schwartz, female    DOB: 1981-03-27, 38 y.o.   MRN: [id number]  HPI: Taylor Schwartz is a 38 y.o. female  Chief Complaint  Patient presents with  . Follow-up  . Sore Throat    HPI She is here for f/u She stays tired all the time Her FitBit tracks her sleep; she says she doesn't sleep; last night she got 2 hours and 5 minutes of sleep She constantly has stuff going through her mind; she will wake up and get out of bed and take care of tasks; she says she read about it and thought she had "manic something"; saw a commercial; feels overwhelmed all the time; gets side tracked all the time; very sporadic; this has been going on for a while; I asked for a time; since before Thanksgiving; getting worse; thought it was the holidays, only one who cooks and shops and home schools her son; constantly going; in Sept, she would get maybe 6 hours of sleep When she falls asleep, she will get two hours of sleep and then she's up She will play on her phone in bed; she will try relaxing music on her phone I asked if she will try not taking the phone into the bedroom She feels like she cannot get things accomplished fast enough, she says in her mind it has to be done and she'll get a jump start to get stuff done; she's staying up all night to get stuff done; it has been ongoing but is getting worse over the holidays She'll stay awake all night over-thinking things She can compartmentalize things, but will rush through them to get to the next thing; while we were talking about her above issues, she went to the derm appt, [place] appt, and then said she was anxious to find out the results of her test She thinks she averages five hours of sleep and is up and down and up and down; at 9 pm, she starts to wind  down for bed; turns off the light; keeps cell phone in the room; no home phone, does not play on it; lays there watching a show; then takes melatonin 5 mg around 8:15 to 8:30 pm; up by 7 am or earlier I asked about medicine in the past; "they used to keep me on Xanax", 0.5 mg and she would take it not every day; she would take it if she knew something was coming up like a wedding, being around a crowd, why is that person looking at me, is my child going to act okay; I asked if just for crowds; she would get 15 pills a month, she did not always take 15 pills a month; she is going to get overwhelmed her first day of class; the medicine relaxes her; knocks the edge of anxiety off; it was easier when she took her; more calm I asked if anything else; Dr. Ancil Boozer or [facility] tried something else or somewhere else; started with a B and did not She does not think she has sleep apnea  She has an appt with the dermatologist in February; they are checking on her insurance right now; she is touch with them  She goes for her [place] tomorrow morning  She does have sore throat; thought maybe just allergies; did strep test today; does not feel feverish or hot; stays cold all the time; does not feel like she has strep, no exposures; can swallow okay, but it does hurt when she swallows; no swollen glands in her neck; no rash   Ref Range & Units 15:28  Rapid Strep A Screen Negative Negative       Specimen Collected: 05/13/18 15:28  Last Resulted: 05/13/18 15:28     Low vitamin B12  Depression screen [facility] 2/9 05/13/2018 05/07/2018 01/18/2018 07/04/2016  Decreased Interest 0 0 0 0  Down, Depressed, Hopeless 0 0 0 1  PHQ - 2 Score 0 0 0 1  Altered sleeping 0 3 - -  Tired, decreased energy 0 3 - -  Change in appetite 0 3 - -  Feeling bad or failure about yourself  0 0 - -  Trouble concentrating 0 3 - -  Moving slowly or fidgety/restless 0 0 - -  Suicidal thoughts 0 0 - -  PHQ-9 Score 0 12 - -  Difficult doing  work/chores Not difficult at all Somewhat difficult - -   Fall Risk  05/13/2018 05/07/2018 01/18/2018 07/04/2016  Falls in the past year? 0 0 No No  Number falls in past yr: 0 - - -  Injury with Fall? 0 - - -    Relevant past medical, surgical, family and social history reviewed Past Medical History:  Diagnosis Date  . Abnormal mammogram 05/2016  . Anxiety   . Complex cyst of left ovary 07/04/2016  . Endometriosis determined by laparoscopy 2018  . History of kidney stones 06/2017  . PONV (postoperative nausea and vomiting)   . Tobacco abuse 07/04/2016   Past Surgical History:  Procedure Laterality Date  . BREAST SURGERY Left 05/2016   benign. metal clip to identify area when mammograms  . CHOLECYSTECTOMY  2008  . CYSTOSCOPY N/A 07/16/2017   Procedure: CYSTOSCOPY;  Surgeon: Malachy Mood, MD;  Location: [facility] ORS;  Service: Gynecology;  Laterality: N/A;  . LAPAROSCOPIC HYSTERECTOMY Bilateral 07/16/2017   Procedure: HYSTERECTOMY TOTAL LAPAROSCOPIC,LEFT SALPINGO OPHORECTOMY,RIGHT SALPINGECTOMY;  Surgeon: Malachy Mood, MD;  Location: [facility] ORS;  Service: Gynecology;  Laterality: Bilateral;  . LAPAROSCOPIC OVARIAN CYSTECTOMY Left 10/09/2016   Procedure: LAPAROSCOPIC OVARIAN CYSTECTOMY;  Surgeon: Malachy Mood, MD;  Location: [facility] ORS;  Service: Gynecology;  Laterality: Left;  . mole removed  2016   area under breast removed  . nsvd  2007   Family History  Problem Relation Age of Onset  . Lupus Mother   . Arthritis Mother   . Glaucoma Mother   . Vision loss Mother   . Diabetes Mother   . Ovarian cancer Mother 54  . Diabetes Father   . Prostate cancer Father 52  . Diabetes Maternal Grandmother   . Dementia Maternal Grandmother   . Diabetes Maternal Grandfather   . Pneumonia Maternal Grandfather   . Diabetes Paternal Grandmother   . Diabetes Paternal Grandfather    Social History   Tobacco Use  . Smoking status: Current Every Day Smoker    Packs/day: 0.50  . Smokeless  tobacco: Never Used  Substance Use Topics  . Alcohol use: No  . Drug use: No     [facility] Visit from 05/13/2018 in [facility]  AUDIT-C Score  0      Interim medical history since last visit reviewed. Allergies and medications reviewed  Review of Systems Per HPI unless specifically indicated above     Objective:    BP 108/62   Pulse 89   Temp 99.4 F (37.4 C) (Oral)   Ht 5\' 7"  (1.702 m)   Wt 138 lb 1.6 oz (62.6 kg)   LMP 06/30/2017 (Approximate)   SpO2 99%   BMI 21.63 kg/m   Wt Readings from Last 3 Encounters:  05/13/18 138 lb 1.6 oz (62.6 kg)  05/07/18 140 lb (63.5 kg)  01/18/18 136 lb 11.2 oz (62 kg)    Physical Exam Constitutional:      Appearance: She is well-developed.  Eyes:     General: No scleral icterus. Cardiovascular:     Rate and Rhythm: Normal rate and regular rhythm.  Pulmonary:     Effort: Pulmonary effort is normal.     Breath sounds: Normal breath sounds.  Lymphadenopathy:     Cervical: Cervical adenopathy (shoddy) present.  Psychiatric:        Behavior: Behavior normal.       Assessment & Plan:   Problem List Items Addressed This Visit      Other   Poor sleep    Refer to therapist for CBT; get the TV out of the bedroom      Relevant Orders   Ambulatory referral to Psychology   Anxiety - Primary    Refer to therapist for CBT      Relevant Medications   sertraline (ZOLOFT) 50 MG tablet   Other Relevant Orders   Ambulatory referral to Psychology    Other Visit Diagnoses    Sore throat       no fever; does not appear to be strep, may be viral or allergies or weather   Relevant Orders   POCT rapid strep A (Completed)   Culture, Group A Strep (Completed)       Follow up plan: Return in about 1 month (around 06/13/2018) for follow-up visit with Dr. Sanda Klein.  An after-visit summary was printed and given to the patient at [place].  Please see the patient instructions which may contain other information and  recommendations beyond what is mentioned above in the assessment and plan.  Meds ordered this encounter  Medications  . sertraline (ZOLOFT) 50 MG tablet    Sig: One-half of a pill by mouth every day for four days, then one whole pill daily    Dispense:  30 tablet    Refill:  0    Orders Placed This Encounter  Procedures  . Culture, Group A Strep  . Ambulatory referral to Psychology  . POCT rapid strep A

## 2018-05-14 ENCOUNTER — Ambulatory Visit
Admission: RE | Admit: 2018-05-14 | Discharge: 2018-05-14 | Disposition: A | Discharge status: Home / Self Care | Payer: Medicaid Other | Source: Ambulatory Visit | Attending: Family Medicine | Admitting: Family Medicine

## 2018-05-14 ENCOUNTER — Other Ambulatory Visit: Payer: Self-pay | Admitting: Family Medicine

## 2018-05-14 DIAGNOSIS — Z85820 Personal history of malignant melanoma of skin: Secondary | ICD-10-CM | POA: Insufficient documentation

## 2018-05-14 DIAGNOSIS — R9389 Abnormal findings on diagnostic imaging of other specified body structures: Secondary | ICD-10-CM

## 2018-05-14 DIAGNOSIS — R1013 Epigastric pain: Secondary | ICD-10-CM

## 2018-05-14 DIAGNOSIS — K838 Other specified diseases of biliary tract: Secondary | ICD-10-CM

## 2018-05-14 DIAGNOSIS — R1011 Right upper quadrant pain: Secondary | ICD-10-CM

## 2018-05-14 DIAGNOSIS — R5383 Other fatigue: Secondary | ICD-10-CM | POA: Insufficient documentation

## 2018-05-15 LAB — CULTURE, GROUP A STREP
MICRO NUMBER:: 5106
SPECIMEN QUALITY: ADEQUATE

## 2018-05-17 ENCOUNTER — Encounter: Payer: Self-pay | Admitting: Family Medicine

## 2018-05-24 ENCOUNTER — Encounter: Payer: Medicaid Other | Admitting: Family Medicine

## 2018-06-02 ENCOUNTER — Ambulatory Visit
Admission: RE | Admit: 2018-06-02 | Discharge: 2018-06-02 | Disposition: A | Discharge status: Home / Self Care | Payer: Medicaid Other | Source: Ambulatory Visit | Attending: Family Medicine | Admitting: Family Medicine

## 2018-06-02 DIAGNOSIS — K838 Other specified diseases of biliary tract: Secondary | ICD-10-CM | POA: Insufficient documentation

## 2018-06-02 DIAGNOSIS — R1011 Right upper quadrant pain: Secondary | ICD-10-CM | POA: Insufficient documentation

## 2018-06-02 DIAGNOSIS — R9389 Abnormal findings on diagnostic imaging of other specified body structures: Secondary | ICD-10-CM | POA: Diagnosis present

## 2018-06-02 DIAGNOSIS — Z85820 Personal history of malignant melanoma of skin: Secondary | ICD-10-CM

## 2018-06-02 MED ORDER — GADOBUTROL 1 MMOL/ML IV SOLN
6.0000 mL | Freq: Once | INTRAVENOUS | Status: AC | PRN
  Administered 2018-06-02: 6 mL via INTRAVENOUS

## 2018-06-18 ENCOUNTER — Encounter: Payer: Self-pay | Admitting: Family Medicine

## 2018-06-18 ENCOUNTER — Telehealth: Payer: Self-pay | Admitting: Family Medicine

## 2018-06-18 MED ORDER — SERTRALINE HCL 50 MG PO TABS: 50.0000 mg | ORAL_TABLET | Freq: Every day | ORAL | 0 refills | Status: DC

## 2018-06-18 NOTE — Telephone Encounter (Signed)
Patient was supposed to f/u in one month (around February 2nd) I don't see an appointment for her Please ask her to schedule visit with me or Benjamine Mola within one week I sent 7 pills

## 2018-06-21 NOTE — Telephone Encounter (Signed)
Pt has appt for 2.20.2020. I asked pt if she wanted to come in sooner and she stated that she will keep the appt she already have

## 2018-07-01 ENCOUNTER — Encounter: Payer: Self-pay | Admitting: Family Medicine

## 2018-07-01 ENCOUNTER — Ambulatory Visit: Payer: Medicaid Other | Admitting: Nurse Practitioner

## 2018-07-01 VITALS — BP 112/68 | HR 82 | Temp 98.5°F | Resp 16 | Ht 67.0 in | Wt 137.1 lb

## 2018-07-01 DIAGNOSIS — F419 Anxiety disorder, unspecified: Secondary | ICD-10-CM | POA: Diagnosis not present

## 2018-07-01 MED ORDER — HYDROXYZINE PAMOATE 25 MG PO CAPS: 25.0000 mg | ORAL_CAPSULE | Freq: Every day | ORAL | 0 refills | Status: DC | PRN

## 2018-07-01 NOTE — Patient Instructions (Addendum)
-   [facility]  [phone] - Start taking 1/2 tablet of zoloft the for the next week. If you have significant nausea, body aches, headaches when going down to half tablet let us know and I can send a few more zoloft to help you taper down slower  - Please take half a tablet hydroxyzine as needed for anxiety, can take a whole tablet if needed. If having any concerning symptoms please let us know right away. Can cause drowsiness.

## 2018-07-01 NOTE — Progress Notes (Deleted)
BP 112/68 (BP Location: Left Arm, Patient Position: Sitting, Cuff Size: Normal)   Pulse 82   Temp 98.5 F (36.9 C) (Oral)   Resp 16   Ht 5\' 7"  (1.702 m)   Wt 137 lb 1.6 oz (62.2 kg)   LMP 06/30/2017 (Approximate)   SpO2 99%   BMI 21.47 kg/m    Subjective:    Patient ID: Taylor Schwartz, female    DOB: 14-May-1980, 38 y.o.   MRN: [id number]  HPI: Taylor Schwartz is a 38 y.o. female  Chief Complaint  Patient presents with  . Follow-up    patient wants to talk about the side effects of the new medication (zoloft)    HPI Last month patient was started on zoloft was supposed to take half tab for 5 days and supposed to titrate up but she was feeling nauseated and feels something is crawling under her finger and feels more aggravated. She did the half tablet for 10 days and then titrated up to whole tablet but symptoms worsened and have not improved. She was taking it for anxiety. Was on the xanax in the past for acute anxiety and would not take it daily but just as needed. Feels she does not need something every day- she is not stressed on the weekends. Feels she just needs something 3-5 days a week. States she has trouble sleeping- took the TV out of the room, stopped looking at phone and has increased to 5 hours of sleep a night but still not much.   GAD 7 : Generalized Anxiety Score 07/01/2018  Nervous, Anxious, on Edge 3  Control/stop worrying 2  Worry too much - different things 2  Trouble relaxing 2  Restless 2  Easily annoyed or irritable 0  Afraid - awful might happen 0  Total GAD 7 Score 11  Anxiety Difficulty Somewhat difficult    Depression screen [facility] 2/9 07/01/2018 05/13/2018 05/07/2018 01/18/2018 07/04/2016  Decreased Interest 0 0 0 0 0  Down, Depressed, Hopeless 0 0 0 0 1  PHQ - 2 Score 0 0 0 0 1  Altered sleeping 0 0 3 - -  Tired, decreased energy 0 0 3 - -  Change in appetite 0 0 3 - -  Feeling bad or failure about yourself  0 0 0 - -  Trouble concentrating 0 0  3 - -  Moving slowly or fidgety/restless 0 0 0 - -  Suicidal thoughts 0 0 0 - -  PHQ-9 Score 0 0 12 - -  Difficult doing work/chores Not difficult at all Not difficult at all Somewhat difficult - -   Fall Risk  07/01/2018 05/13/2018 05/07/2018 01/18/2018 07/04/2016  Falls in the past year? 0 0 0 No No  Number falls in past yr: 0 0 - - -  Injury with Fall? 0 0 - - -    Relevant past medical, surgical, family and social history reviewed Past Medical History:  Diagnosis Date  . Abnormal mammogram 05/2016  . Anxiety   . Complex cyst of left ovary 07/04/2016  . Endometriosis determined by laparoscopy 2018  . History of kidney stones 06/2017  . PONV (postoperative nausea and vomiting)   . Tobacco abuse 07/04/2016   Past Surgical History:  Procedure Laterality Date  . BREAST SURGERY Left 05/2016   benign. metal clip to identify area when mammograms  . CHOLECYSTECTOMY  2008  . CYSTOSCOPY N/A 07/16/2017   Procedure: CYSTOSCOPY;  Surgeon: Malachy Mood, MD;  Location: [facility]  ORS;  Service: Gynecology;  Laterality: N/A;  . LAPAROSCOPIC HYSTERECTOMY Bilateral 07/16/2017   Procedure: HYSTERECTOMY TOTAL LAPAROSCOPIC,LEFT SALPINGO OPHORECTOMY,RIGHT SALPINGECTOMY;  Surgeon: Malachy Mood, MD;  Location: [facility] ORS;  Service: Gynecology;  Laterality: Bilateral;  . LAPAROSCOPIC OVARIAN CYSTECTOMY Left 10/09/2016   Procedure: LAPAROSCOPIC OVARIAN CYSTECTOMY;  Surgeon: Malachy Mood, MD;  Location: [facility] ORS;  Service: Gynecology;  Laterality: Left;  . mole removed  2016   area under breast removed  . nsvd  2007   Family History  Problem Relation Age of Onset  . Lupus Mother   . Arthritis Mother   . Glaucoma Mother   . Vision loss Mother   . Diabetes Mother   . Ovarian cancer Mother 53  . Diabetes Father   . Prostate cancer Father 53  . Diabetes Maternal Grandmother   . Dementia Maternal Grandmother   . Diabetes Maternal Grandfather   . Pneumonia Maternal Grandfather   . Diabetes Paternal  Grandmother   . Diabetes Paternal Grandfather   . Autism Son    Social History   Tobacco Use  . Smoking status: Current Every Day Smoker    Packs/day: 0.50  . Smokeless tobacco: Never Used  Substance Use Topics  . Alcohol use: No  . Drug use: No     [facility] Visit from 07/01/2018 in [facility]  AUDIT-C Score  0      Interim medical history since last visit reviewed. Allergies and medications reviewed  Review of Systems Per HPI unless specifically indicated above     Objective:    BP 112/68 (BP Location: Left Arm, Patient Position: Sitting, Cuff Size: Normal)   Pulse 82   Temp 98.5 F (36.9 C) (Oral)   Resp 16   Ht 5\' 7"  (1.702 m)   Wt 137 lb 1.6 oz (62.2 kg)   LMP 06/30/2017 (Approximate)   SpO2 99%   BMI 21.47 kg/m   Wt Readings from Last 3 Encounters:  07/01/18 137 lb 1.6 oz (62.2 kg)  05/13/18 138 lb 1.6 oz (62.6 kg)  05/07/18 140 lb (63.5 kg)    Physical Exam  Results for orders placed or performed in visit on 05/13/18  Culture, Group A Strep  Result Value Ref Range   MICRO NUMBER: [id number]    SPECIMEN QUALITY: Adequate    SOURCE: THROAT    STATUS: FINAL    RESULT: No group A Streptococcus isolated   POCT rapid strep A  Result Value Ref Range   Rapid Strep A Screen Negative Negative      Assessment & Plan:   Problem List Items Addressed This Visit    None       Follow up plan: No follow-ups on file.  An after-visit summary was printed and given to the patient at [place].  Please see the patient instructions which may contain other information and recommendations beyond what is mentioned above in the assessment and plan.  No orders of the defined types were placed in this encounter.   No orders of the defined types were placed in this encounter.

## 2018-07-01 NOTE — Progress Notes (Signed)
Name: Taylor Schwartz   MRN: [id number]    DOB: 03-08-1981   Date:07/01/2018       Progress Note  Subjective  Chief Complaint  Chief Complaint  Patient presents with  . Follow-up    patient wants to talk about the side effects of the new medication (zoloft)    HPI  Last month patient was started on zoloft was supposed to take half tab for 5 days and supposed to titrate up but she was feeling nauseated and feels something is crawling under her finger and feels more aggravated. She did the half tablet for 10 days and then titrated up to whole tablet but symptoms worsened and have not improved. She was taking it for anxiety. Was on the xanax in the past for acute anxiety and would not take it daily but just as needed. Feels she does not need something every day- she is not stressed on the weekends, just when having to deal with excessive family stress. Feels she just needs something 3-5 days a week, wants to avoid a daily mediaction. States she has trouble sleeping- took the TV out of the room, stopped looking at phone and has increased to 5 hours of sleep a night but still not much. States was referred to psychology but next available new patient appointment was made for December 2020.  GAD 7 : Generalized Anxiety Score 07/01/2018  Nervous, Anxious, on Edge 3  Control/stop worrying 2  Worry too much - different things 2  Trouble relaxing 2  Restless 2  Easily annoyed or irritable 0  Afraid - awful might happen 0  Total GAD 7 Score 11  Anxiety Difficulty Somewhat difficult     Depression screen [facility] 2/9 07/01/2018 05/13/2018 05/07/2018  Decreased Interest 0 0 0  Down, Depressed, Hopeless 0 0 0  PHQ - 2 Score 0 0 0  Altered sleeping 0 0 3  Tired, decreased energy 0 0 3  Change in appetite 0 0 3  Feeling bad or failure about yourself  0 0 0  Trouble concentrating 0 0 3  Moving slowly or fidgety/restless 0 0 0  Suicidal thoughts 0 0 0  PHQ-9 Score 0 0 12  Difficult doing work/chores Not  difficult at all Not difficult at all Somewhat difficult     Patient Active Problem List   Diagnosis Date Noted  . Anxiety 05/13/2018  . Poor sleep 05/13/2018  . Hx of malignant melanoma 05/07/2018  . S/P laparoscopic hysterectomy 07/16/2017  . Endometriosis determined by laparoscopy 10/16/2016  . Diarrhea 07/04/2016  . Abdominal pain 07/04/2016  . Complex cyst of left ovary 07/04/2016  . Tobacco abuse 07/04/2016  . Depression with anxiety 10/17/2014    Past Medical History:  Diagnosis Date  . Abnormal mammogram 05/2016  . Anxiety   . Complex cyst of left ovary 07/04/2016  . Endometriosis determined by laparoscopy 2018  . History of kidney stones 06/2017  . PONV (postoperative nausea and vomiting)   . Tobacco abuse 07/04/2016    Past Surgical History:  Procedure Laterality Date  . BREAST SURGERY Left 05/2016   benign. metal clip to identify area when mammograms  . CHOLECYSTECTOMY  2008  . CYSTOSCOPY N/A 07/16/2017   Procedure: CYSTOSCOPY;  Surgeon: Malachy Mood, MD;  Location: [facility] ORS;  Service: Gynecology;  Laterality: N/A;  . LAPAROSCOPIC HYSTERECTOMY Bilateral 07/16/2017   Procedure: HYSTERECTOMY TOTAL LAPAROSCOPIC,LEFT SALPINGO OPHORECTOMY,RIGHT SALPINGECTOMY;  Surgeon: Malachy Mood, MD;  Location: [facility] ORS;  Service: Gynecology;  Laterality: Bilateral;  .  LAPAROSCOPIC OVARIAN CYSTECTOMY Left 10/09/2016   Procedure: LAPAROSCOPIC OVARIAN CYSTECTOMY;  Surgeon: Malachy Mood, MD;  Location: [facility] ORS;  Service: Gynecology;  Laterality: Left;  . mole removed  2016   area under breast removed  . nsvd  2007    Social History   Tobacco Use  . Smoking status: Current Every Day Smoker    Packs/day: 0.50  . Smokeless tobacco: Never Used  Substance Use Topics  . Alcohol use: No     Current Outpatient Medications:  .  sertraline (ZOLOFT) 50 MG tablet, Take 1 tablet (50 mg total) by mouth daily. Appointment please, Disp: 7 tablet, Rfl: 0 .  vitamin B-12  (CYANOCOBALAMIN) 500 MCG tablet, Take 500 mcg by mouth daily., Disp: , Rfl:  .  hydrOXYzine (VISTARIL) 25 MG capsule, Take 1 capsule (25 mg total) by mouth daily as needed for anxiety., Disp: 30 capsule, Rfl: 0  Allergies  Allergen Reactions  . Cyclobenzaprine Itching  . Mobic [Meloxicam] Anaphylaxis  . Tramadol Hives  . Amoxicillin Hives and Itching    Has patient had a PCN reaction causing immediate rash, facial/tongue/throat swelling, SOB or lightheadedness with hypotension: Yes Has patient had a PCN reaction causing severe rash involving mucus membranes or skin necrosis: No Has patient had a PCN reaction that required hospitalization: No Has patient had a PCN reaction occurring within the last 10 years: No If all of the above answers are "NO", then may proceed with Cephalosporin use.    Marland Kitchen Penicillins Hives, Itching and Rash    Has patient had a PCN reaction causing immediate rash, facial/tongue/throat swelling, SOB or lightheadedness with hypotension: Yes Has patient had a PCN reaction causing severe rash involving mucus membranes or skin necrosis: No Has patient had a PCN reaction that required hospitalization: No Has patient had a PCN reaction occurring within the last 10 years: No If all of the above answers are "NO", then may proceed with Cephalosporin use.     ROS   No other specific complaints in a complete review of systems (except as listed in HPI above).  Objective  Vitals:   07/01/18 1042  BP: 112/68  Pulse: 82  Resp: 16  Temp: 98.5 F (36.9 C)  TempSrc: Oral  SpO2: 99%  Weight: 137 lb 1.6 oz (62.2 kg)  Height: 5\' 7"  (1.702 m)    Body mass index is 21.47 kg/m.  Nursing Note and Vital Signs reviewed.  Physical Exam Constitutional:      Appearance: Normal appearance. She is well-developed.  HENT:     Head: Normocephalic and atraumatic.     Right Ear: Hearing normal.     Left Ear: Hearing normal.  Eyes:     Conjunctiva/sclera: Conjunctivae normal.   Cardiovascular:     Rate and Rhythm: Normal rate.     Heart sounds: Normal heart sounds.  Pulmonary:     Effort: Pulmonary effort is normal.  Musculoskeletal: Normal range of motion.  Neurological:     General: No focal deficit present.     Mental Status: She is alert and oriented to person, place, and time.  Psychiatric:        Mood and Affect: Mood normal.        Speech: Speech normal.        Behavior: Behavior normal. Behavior is cooperative.        Thought Content: Thought content normal.        Judgment: Judgment normal.      No results found for this  or any previous visit (from the past 48 hour(s)).  Assessment & Plan  1. Anxiety Discussed buspar and lexapro, would like to trial a PRN medication and counseling and will follow-up in one month, if needing it frequently will try a daily medication. Given counseling resources.  - hydrOXYzine (VISTARIL) 25 MG capsule; Take 1 capsule (25 mg total) by mouth daily as needed for anxiety.  Dispense: 30 capsule; Refill: 0

## 2018-07-30 ENCOUNTER — Ambulatory Visit: Payer: Medicaid Other | Admitting: Family Medicine

## 2018-08-02 ENCOUNTER — Ambulatory Visit: Payer: Medicaid Other | Admitting: Nurse Practitioner

## 2018-08-02 ENCOUNTER — Encounter: Payer: Self-pay | Admitting: Nurse Practitioner

## 2018-08-02 ENCOUNTER — Other Ambulatory Visit: Payer: Self-pay

## 2018-08-02 VITALS — BP 118/70 | HR 77 | Temp 98.3°F | Resp 14 | Ht 67.0 in | Wt 139.1 lb

## 2018-08-02 DIAGNOSIS — F419 Anxiety disorder, unspecified: Secondary | ICD-10-CM

## 2018-08-02 MED ORDER — HYDROXYZINE PAMOATE 25 MG PO CAPS: 25.0000 mg | ORAL_CAPSULE | Freq: Every day | ORAL | 0 refills | Status: DC | PRN

## 2018-08-02 NOTE — Patient Instructions (Signed)
12 Ways to Curb Anxiety  ?Anxiety is normal human sensation. It is what helped our ancestors survive the pitfalls of the wilderness. Anxiety is defined as experiencing worry or nervousness about an imminent event or something with an uncertain outcome. It is a feeling experienced by most people at some point in their lives. Anxiety can be triggered by a very personal issue, such as the illness of a loved one, or an event of global proportions, such as a refugee crisis. Some of the symptoms of anxiety are:  Feeling restless.  Having a feeling of impending danger.  Increased heart rate.  Rapid breathing. Sweating.  Shaking.  Weakness or feeling tired.  Difficulty concentrating on anything except the current worry.  Insomnia.  Stomach or bowel problems. What can we do about anxiety we may be feeling? There are many techniques to help manage stress and relax. Here are 12 ways you can reduce your anxiety almost immediately: 1. Turn off the constant feed of information. Take a social media sabbatical. Studies have shown that social media directly contributes to social anxiety.  2. Monitor your television viewing habits. Are you watching shows that are also contributing to your anxiety, such as 24-hour news stations? Try watching something else, or better yet, nothing at all. Instead, listen to music, read an inspirational book or practice a hobby. 3. Eat nutritious meals. Also, don't skip meals and keep healthful snacks on hand. Hunger and poor diet contributes to feeling anxious. 4. Sleep. Sleeping on a regular schedule for at least seven to eight hours a night will do wonders for your outlook when you are awake. 5. Exercise. Regular exercise will help rid your body of that anxious energy and help you get more restful sleep. 6. Try deep (diaphragmatic) breathing. Inhale slowly through your nose for five seconds and exhale through your mouth. 7. Practice acceptance and gratitude. When anxiety hits,  accept that there are things out of your control that shouldn't be of immediate concern.  8. Seek out humor. When anxiety strikes, watch a funny video, read jokes or call a friend who makes you laugh. Laughter is healing for our bodies and releases endorphins that are calming. 9. Stay positive. Take the effort to replace negative thoughts with positive ones. Try to see a stressful situation in a positive light. Try to come up with solutions rather than dwelling on the problem. 10. Figure out what triggers your anxiety. Keep a journal and make note of anxious moments and the events surrounding them. This will help you identify triggers you can avoid or even eliminate. 11. Talk to someone. Let a trusted friend, family member or even trained professional know that you are feeling overwhelmed and anxious. Verbalize what you are feeling and why.  12. Volunteer. If your anxiety is triggered by a crisis on a large scale, become an advocate and work to resolve the problem that is causing you unease. Anxiety is often unwelcome and can become overwhelming. If not kept in check, it can become a disorder that could require medical treatment. However, if you take the time to care for yourself and avoid the triggers that make you anxious, you will be able to find moments of relaxation and clarity that make your life much more enjoyable.

## 2018-08-02 NOTE — Progress Notes (Signed)
Name: Taylor Schwartz   MRN: [id number]    DOB: July 31, 1980   Date:08/02/2018       Progress Note  Subjective  Chief Complaint  Chief Complaint  Patient presents with  . Follow-up    HPI Patient presents for one month follow-up on anxiety. Was titrated off of zoloft at last visit per patient request and prescribed hydroxyzine PRN for acute anxiety and recommended counseling.   She came off of zoloft and is feeling much better, no side effects coming off it. Has taken 6-7 of the hydroxyzine pills since. Has not started counseling.   Patient doesn't drink soda or tea, eats one meal a day- dinner. Has increased some exercise.  Wt Readings from Last 3 Encounters:  08/02/18 139 lb 1.6 oz (63.1 kg)  07/01/18 137 lb 1.6 oz (62.2 kg)  05/13/18 138 lb 1.6 oz (62.6 kg)     GAD 7 : Generalized Anxiety Score 08/02/2018 07/01/2018  Nervous, Anxious, on Edge 0 3  Control/stop worrying 0 2  Worry too much - different things 0 2  Trouble relaxing 0 2  Restless 0 2  Easily annoyed or irritable 0 0  Afraid - awful might happen 0 0  Total GAD 7 Score 0 11  Anxiety Difficulty Not difficult at all Somewhat difficult     PHQ2/9: Depression screen [facility] 2/9 08/02/2018 07/01/2018 05/13/2018 05/07/2018 01/18/2018  Decreased Interest 0 0 0 0 0  Down, Depressed, Hopeless 0 0 0 0 0  PHQ - 2 Score 0 0 0 0 0  Altered sleeping 3 0 0 3 -  Tired, decreased energy 0 0 0 3 -  Change in appetite 0 0 0 3 -  Feeling bad or failure about yourself  0 0 0 0 -  Trouble concentrating 0 0 0 3 -  Moving slowly or fidgety/restless 0 0 0 0 -  Suicidal thoughts 0 0 0 0 -  PHQ-9 Score 3 0 0 12 -  Difficult doing work/chores Not difficult at all Not difficult at all Not difficult at all Somewhat difficult -    PHQ reviewed. Negative  Patient Active Problem List   Diagnosis Date Noted  . Anxiety 05/13/2018  . Poor sleep 05/13/2018  . Hx of malignant melanoma 05/07/2018  . S/P laparoscopic hysterectomy 07/16/2017  .  Endometriosis determined by laparoscopy 10/16/2016  . Diarrhea 07/04/2016  . Abdominal pain 07/04/2016  . Complex cyst of left ovary 07/04/2016  . Tobacco abuse 07/04/2016  . Depression with anxiety 10/17/2014    Past Medical History:  Diagnosis Date  . Abnormal mammogram 05/2016  . Anxiety   . Complex cyst of left ovary 07/04/2016  . Endometriosis determined by laparoscopy 2018  . History of kidney stones 06/2017  . PONV (postoperative nausea and vomiting)   . Tobacco abuse 07/04/2016    Past Surgical History:  Procedure Laterality Date  . BREAST SURGERY Left 05/2016   benign. metal clip to identify area when mammograms  . CHOLECYSTECTOMY  2008  . CYSTOSCOPY N/A 07/16/2017   Procedure: CYSTOSCOPY;  Surgeon: Malachy Mood, MD;  Location: [facility];  Service: Gynecology;  Laterality: N/A;  . LAPAROSCOPIC HYSTERECTOMY Bilateral 07/16/2017   Procedure: HYSTERECTOMY TOTAL LAPAROSCOPIC,LEFT SALPINGO OPHORECTOMY,RIGHT SALPINGECTOMY;  Surgeon: Malachy Mood, MD;  Location: [facility] OR[facility];  Service: Gynecology;  Laterality: Bilateral;  . LAPAROSCOPIC OVARIAN CYSTECTOMY Left 10/09/2016   Procedure: LAPAROSCOPIC OVARIAN CYSTECTOMY;  Surgeon: Malachy Mood, MD;  Location: [facility];  Service: Gynecology;  Laterality: Left;  . mole  removed  2016   area under breast removed  . nsvd  2007    Social History   Tobacco Use  . Smoking status: Current Every Day Smoker    Packs/day: 0.50  . Smokeless tobacco: Never Used  Substance Use Topics  . Alcohol use: No     Current Outpatient Medications:  .  hydrOXYzine (VISTARIL) 25 MG capsule, Take 1 capsule (25 mg total) by mouth daily as needed for anxiety., Disp: 30 capsule, Rfl: 0 .  vitamin B-12 (CYANOCOBALAMIN) 500 MCG tablet, Take 500 mcg by mouth daily., Disp: , Rfl:  .  sertraline (ZOLOFT) 50 MG tablet, Take 1 tablet (50 mg total) by mouth daily. Appointment please (Patient not taking: Reported on 08/02/2018), Disp: 7 tablet, Rfl:  0  Allergies  Allergen Reactions  . Cyclobenzaprine Itching  . Mobic [Meloxicam] Anaphylaxis  . Tramadol Hives  . Amoxicillin Hives and Itching    Has patient had a PCN reaction causing immediate rash, facial/tongue/throat swelling, SOB or lightheadedness with hypotension: Yes Has patient had a PCN reaction causing severe rash involving mucus membranes or skin necrosis: No Has patient had a PCN reaction that required hospitalization: No Has patient had a PCN reaction occurring within the last 10 years: No If all of the above answers are "NO", then may proceed with Cephalosporin use.    Marland Kitchen Penicillins Hives, Itching and Rash    Has patient had a PCN reaction causing immediate rash, facial/tongue/throat swelling, SOB or lightheadedness with hypotension: Yes Has patient had a PCN reaction causing severe rash involving mucus membranes or skin necrosis: No Has patient had a PCN reaction that required hospitalization: No Has patient had a PCN reaction occurring within the last 10 years: No If all of the above answers are "NO", then may proceed with Cephalosporin use.     ROS   No other specific complaints in a complete review of systems (except as listed in HPI above).  Objective  Vitals:   08/02/18 0932  BP: 118/70  Pulse: 77  Resp: 14  Temp: 98.3 F (36.8 C)  SpO2: 98%  Weight: 139 lb 1.6 oz (63.1 kg)  Height: 5\' 7"  (1.702 m)    Body mass index is 21.79 kg/m.  Nursing Note and Vital Signs reviewed.  Physical Exam Constitutional:      Appearance: Normal appearance. She is well-developed.  HENT:     Head: Normocephalic and atraumatic.     Right Ear: Hearing normal.     Left Ear: Hearing normal.  Eyes:     Conjunctiva/sclera: Conjunctivae normal.  Cardiovascular:     Rate and Rhythm: Normal rate and regular rhythm.     Heart sounds: Normal heart sounds.  Pulmonary:     Effort: Pulmonary effort is normal.     Breath sounds: Normal breath sounds.  Musculoskeletal:  Normal range of motion.  Neurological:     Mental Status: She is alert and oriented to person, place, and time.  Psychiatric:        Speech: Speech normal.        Behavior: Behavior normal. Behavior is cooperative.        Thought Content: Thought content normal.        Judgment: Judgment normal.       No results found for this or any previous visit (from the past 48 hour(s)).  Assessment & Plan  1. Anxiety Much improved,  - hydrOXYzine (VISTARIL) 25 MG capsule; Take 1 capsule (25 mg total) by mouth daily  as needed for anxiety.  Dispense: 90 capsule; Refill: 0

## 2018-09-01 ENCOUNTER — Ambulatory Visit (INDEPENDENT_AMBULATORY_CARE_PROVIDER_SITE_OTHER): Payer: Medicaid Other | Admitting: Nurse Practitioner

## 2018-09-01 ENCOUNTER — Other Ambulatory Visit: Payer: Self-pay

## 2018-09-01 ENCOUNTER — Encounter: Payer: Self-pay | Admitting: Nurse Practitioner

## 2018-09-01 VITALS — Temp 99.1°F | Ht 67.0 in | Wt 134.0 lb

## 2018-09-01 DIAGNOSIS — T148XXA Other injury of unspecified body region, initial encounter: Secondary | ICD-10-CM | POA: Diagnosis not present

## 2018-09-01 DIAGNOSIS — M545 Low back pain, unspecified: Secondary | ICD-10-CM

## 2018-09-01 MED ORDER — TIZANIDINE HCL 4 MG PO TABS: 4.0000 mg | ORAL_TABLET | Freq: Four times a day (QID) | ORAL | 2 refills | Status: DC | PRN

## 2018-09-01 NOTE — Progress Notes (Signed)
Virtual Visit via Video Note  I connected with Taylor Schwartz on 09/01/18 at  1:20 PM EDT by a video enabled telemedicine application and verified that I am speaking with the correct person using two identifiers.   Staff discussed the limitations of evaluation and management by telemedicine and the availability of in person appointments. The patient expressed understanding and agreed to proceed.   Patient location: home  My location: home office Other people present: none HPI  Patient endorses lower back pain when she first sits down or when she first standing up. States this has been going on for about a week.- more right and center. States urine has foul odor but is clear. Endorses some urinary frequency- but states she has been drinking more water. Denies dysuria, nausea, vomiting, fever, diarrhea, constipation, vaginal discharge or itching.  Fiance was diagnosed with epidymitis and is concerned if she has gotten an infection from him. Is in a monogamous relationship.   Has a history of scoliosis and degenerative disc disease- states pain is in the same location; saw ortho and was prescribed tizanidine and prednisone and had relief of symptoms. They additionally gave her hydrocodone but she cannot tolerate this.   Has moderate relief with ibuprofen. Pain worse with some movements. Denies any known injuries. States has started jogging recently.  PHQ2/9: Depression screen [facility] 2/9 09/01/2018 08/02/2018 07/01/2018 05/13/2018 05/07/2018  Decreased Interest 0 0 0 0 0  Down, Depressed, Hopeless 0 0 0 0 0  PHQ - 2 Score 0 0 0 0 0  Altered sleeping 0 3 0 0 3  Tired, decreased energy 0 0 0 0 3  Change in appetite 0 0 0 0 3  Feeling bad or failure about yourself  0 0 0 0 0  Trouble concentrating 0 0 0 0 3  Moving slowly or fidgety/restless 0 0 0 0 0  Suicidal thoughts 0 0 0 0 0  PHQ-9 Score 0 3 0 0 12  Difficult doing work/chores Not difficult at all Not difficult at all Not difficult at all Not  difficult at all Somewhat difficult    PHQ reviewed. Negative  Patient Active Problem List   Diagnosis Date Noted  . Anxiety 05/13/2018  . Poor sleep 05/13/2018  . Hx of malignant melanoma 05/07/2018  . S/P laparoscopic hysterectomy 07/16/2017  . Endometriosis determined by laparoscopy 10/16/2016  . Diarrhea 07/04/2016  . Abdominal pain 07/04/2016  . Complex cyst of left ovary 07/04/2016  . Tobacco abuse 07/04/2016  . Depression with anxiety 10/17/2014    Past Medical History:  Diagnosis Date  . Abnormal mammogram 05/2016  . Anxiety   . Complex cyst of left ovary 07/04/2016  . Endometriosis determined by laparoscopy 2018  . History of kidney stones 06/2017  . PONV (postoperative nausea and vomiting)   . Tobacco abuse 07/04/2016    Past Surgical History:  Procedure Laterality Date  . BREAST SURGERY Left 05/2016   benign. metal clip to identify area when mammograms  . CHOLECYSTECTOMY  2008  . CYSTOSCOPY N/A 07/16/2017   Procedure: CYSTOSCOPY;  Surgeon: Malachy Mood, MD;  Location: [facility];  Service: Gynecology;  Laterality: N/A;  . LAPAROSCOPIC HYSTERECTOMY Bilateral 07/16/2017   Procedure: HYSTERECTOMY TOTAL LAPAROSCOPIC,LEFT SALPINGO OPHORECTOMY,RIGHT SALPINGECTOMY;  Surgeon: Malachy Mood, MD;  Location: [facility];  Service: Gynecology;  Laterality: Bilateral;  . LAPAROSCOPIC OVARIAN CYSTECTOMY Left 10/09/2016   Procedure: LAPAROSCOPIC OVARIAN CYSTECTOMY;  Surgeon: Malachy Mood, MD;  Location: [facility];  Service: Gynecology;  Laterality: Left;  .  mole removed  2016   area under breast removed  . nsvd  2007    Social History   Tobacco Use  . Smoking status: Current Every Day Smoker    Packs/day: 0.50    Types: Cigarettes  . Smokeless tobacco: Never Used  Substance Use Topics  . Alcohol use: No     Current Outpatient Medications:  .  vitamin B-12 (CYANOCOBALAMIN) 500 MCG tablet, Take 500 mcg by mouth daily., Disp: , Rfl:  .  hydrOXYzine (VISTARIL) 25  MG capsule, Take 1 capsule (25 mg total) by mouth daily as needed for anxiety. (Patient not taking: Reported on 09/01/2018), Disp: 90 capsule, Rfl: 0  Allergies  Allergen Reactions  . Cyclobenzaprine Itching  . Mobic [Meloxicam] Anaphylaxis  . Tramadol Hives  . Amoxicillin Hives and Itching    Has patient had a PCN reaction causing immediate rash, facial/tongue/throat swelling, SOB or lightheadedness with hypotension: Yes Has patient had a PCN reaction causing severe rash involving mucus membranes or skin necrosis: No Has patient had a PCN reaction that required hospitalization: No Has patient had a PCN reaction occurring within the last 10 years: No If all of the above answers are "NO", then may proceed with Cephalosporin use.    Marland Kitchen Penicillins Hives, Itching and Rash    Has patient had a PCN reaction causing immediate rash, facial/tongue/throat swelling, SOB or lightheadedness with hypotension: Yes Has patient had a PCN reaction causing severe rash involving mucus membranes or skin necrosis: No Has patient had a PCN reaction that required hospitalization: No Has patient had a PCN reaction occurring within the last 10 years: No If all of the above answers are "NO", then may proceed with Cephalosporin use.     ROS    No other specific complaints in a complete review of systems (except as listed in HPI above).  Objective  Vitals:   09/01/18 1133  Temp: 99.1 F (37.3 C)  TempSrc: Oral  Weight: 134 lb (60.8 kg)  Height: 5\' 7"  (1.702 m)    Body mass index is 20.99 kg/m.  Nursing Note and Vital Signs reviewed.  Physical Exam  Constitutional: Patient appears well-developed and well-nourished. No distress.  HENT: Head: Normocephalic and atraumatic. Pulmonary/Chest: Effort normal  Musculoskeletal: pain with bending with lumbar spine- none with extension or twisting Abdominal: No CVA tenderness  Neurological: he is alert and oriented to person, place, and time. speech and  gait are normal.  Skin: No rash noted. No erythema.  Psychiatric: Patient has a normal mood and affect. behavior is normal. Judgment and thought content normal.    Assessment & Plan  There are no diagnoses linked to this encounter.    Follow Up Instructions: 1. Muscle strain Discussed PT- will defer, and at home exercises, heat to the area.  - tiZANidine (ZANAFLEX) 4 MG tablet; Take 1 tablet (4 mg total) by mouth every 6 (six) hours as needed for muscle spasms.  Dispense: 30 tablet; Refill: 2  2. Acute bilateral low back pain without sciatica Patient would like to trial muscle relaxer and NSAID is unimproved can come in to drop of urine sample- no dysuria at this time.  - tiZANidine (ZANAFLEX) 4 MG tablet; Take 1 tablet (4 mg total) by mouth every 6 (six) hours as needed for muscle spasms.  Dispense: 30 tablet; Refill: 2 - Urinalysis, Routine w reflex microscopic; Future    I discussed the assessment and treatment plan with the patient. The patient was provided an opportunity to ask  questions and all were answered. The patient agreed with the plan and demonstrated an understanding of the instructions.   The patient was advised to call back or seek an in-person evaluation if the symptoms worsen or if the condition fails to improve as anticipated.  I provided 20  minutes of non-face-to-face time during this encounter.   Fredderick Severance, NP

## 2018-09-24 ENCOUNTER — Other Ambulatory Visit: Payer: Self-pay | Admitting: Nurse Practitioner

## 2018-09-24 ENCOUNTER — Encounter: Payer: Self-pay | Admitting: Family Medicine

## 2018-09-24 DIAGNOSIS — M545 Low back pain, unspecified: Secondary | ICD-10-CM

## 2018-09-24 MED ORDER — PREDNISONE 10 MG (21) PO TBPK: ORAL_TABLET | ORAL | 0 refills | Status: DC

## 2018-10-04 ENCOUNTER — Encounter: Payer: Self-pay | Admitting: Family Medicine

## 2018-10-05 ENCOUNTER — Other Ambulatory Visit: Payer: Self-pay | Admitting: Nurse Practitioner

## 2018-10-05 DIAGNOSIS — M545 Low back pain, unspecified: Secondary | ICD-10-CM

## 2018-10-22 DIAGNOSIS — M5416 Radiculopathy, lumbar region: Secondary | ICD-10-CM | POA: Diagnosis not present

## 2018-10-27 DIAGNOSIS — M5416 Radiculopathy, lumbar region: Secondary | ICD-10-CM | POA: Insufficient documentation

## 2018-11-01 DIAGNOSIS — M5416 Radiculopathy, lumbar region: Secondary | ICD-10-CM | POA: Diagnosis not present

## 2018-11-02 DIAGNOSIS — M5126 Other intervertebral disc displacement, lumbar region: Secondary | ICD-10-CM | POA: Diagnosis not present

## 2018-11-04 DIAGNOSIS — M5416 Radiculopathy, lumbar region: Secondary | ICD-10-CM | POA: Diagnosis not present

## 2018-11-16 DIAGNOSIS — M5126 Other intervertebral disc displacement, lumbar region: Secondary | ICD-10-CM | POA: Diagnosis not present

## 2018-11-25 DIAGNOSIS — M5126 Other intervertebral disc displacement, lumbar region: Secondary | ICD-10-CM | POA: Diagnosis not present

## 2018-12-08 DIAGNOSIS — M5126 Other intervertebral disc displacement, lumbar region: Secondary | ICD-10-CM | POA: Diagnosis not present

## 2018-12-08 DIAGNOSIS — Z886 Allergy status to analgesic agent status: Secondary | ICD-10-CM | POA: Diagnosis not present

## 2018-12-08 DIAGNOSIS — G8929 Other chronic pain: Secondary | ICD-10-CM | POA: Diagnosis not present

## 2018-12-08 DIAGNOSIS — Z880 Allergy status to penicillin: Secondary | ICD-10-CM | POA: Diagnosis not present

## 2018-12-08 DIAGNOSIS — M5416 Radiculopathy, lumbar region: Secondary | ICD-10-CM | POA: Diagnosis not present

## 2018-12-08 DIAGNOSIS — Z881 Allergy status to other antibiotic agents status: Secondary | ICD-10-CM | POA: Diagnosis not present

## 2018-12-08 DIAGNOSIS — F17210 Nicotine dependence, cigarettes, uncomplicated: Secondary | ICD-10-CM | POA: Diagnosis not present

## 2018-12-08 DIAGNOSIS — Z79891 Long term (current) use of opiate analgesic: Secondary | ICD-10-CM | POA: Diagnosis not present

## 2018-12-08 DIAGNOSIS — Z79899 Other long term (current) drug therapy: Secondary | ICD-10-CM | POA: Diagnosis not present

## 2019-01-21 DIAGNOSIS — M5431 Sciatica, right side: Secondary | ICD-10-CM | POA: Diagnosis not present

## 2019-01-21 DIAGNOSIS — Z4789 Encounter for other orthopedic aftercare: Secondary | ICD-10-CM | POA: Diagnosis not present

## 2019-01-28 DIAGNOSIS — M5431 Sciatica, right side: Secondary | ICD-10-CM | POA: Diagnosis not present

## 2019-01-28 DIAGNOSIS — Z4789 Encounter for other orthopedic aftercare: Secondary | ICD-10-CM | POA: Diagnosis not present

## 2019-02-07 DIAGNOSIS — M5431 Sciatica, right side: Secondary | ICD-10-CM | POA: Diagnosis not present

## 2019-02-07 DIAGNOSIS — Z4789 Encounter for other orthopedic aftercare: Secondary | ICD-10-CM | POA: Diagnosis not present

## 2019-02-17 DIAGNOSIS — Z888 Allergy status to other drugs, medicaments and biological substances status: Secondary | ICD-10-CM | POA: Diagnosis not present

## 2019-02-17 DIAGNOSIS — Z880 Allergy status to penicillin: Secondary | ICD-10-CM | POA: Diagnosis not present

## 2019-02-17 DIAGNOSIS — M5127 Other intervertebral disc displacement, lumbosacral region: Secondary | ICD-10-CM | POA: Diagnosis not present

## 2019-02-17 DIAGNOSIS — M5137 Other intervertebral disc degeneration, lumbosacral region: Secondary | ICD-10-CM | POA: Diagnosis not present

## 2019-02-23 ENCOUNTER — Encounter: Payer: Self-pay | Admitting: Family Medicine

## 2019-02-28 ENCOUNTER — Ambulatory Visit (INDEPENDENT_AMBULATORY_CARE_PROVIDER_SITE_OTHER): Payer: Medicaid Other | Admitting: Family Medicine

## 2019-02-28 ENCOUNTER — Encounter: Payer: Self-pay | Admitting: Family Medicine

## 2019-02-28 ENCOUNTER — Other Ambulatory Visit: Payer: Self-pay

## 2019-02-28 DIAGNOSIS — J989 Respiratory disorder, unspecified: Secondary | ICD-10-CM

## 2019-02-28 DIAGNOSIS — Z720 Tobacco use: Secondary | ICD-10-CM

## 2019-02-28 MED ORDER — GUAIFENESIN ER 600 MG PO TB12: 600.0000 mg | ORAL_TABLET | Freq: Two times a day (BID) | ORAL | 0 refills | Status: DC

## 2019-02-28 MED ORDER — ALBUTEROL SULFATE (2.5 MG/3ML) 0.083% IN NEBU: 2.5000 mg | INHALATION_SOLUTION | Freq: Four times a day (QID) | RESPIRATORY_TRACT | 1 refills | Status: DC | PRN

## 2019-02-28 MED ORDER — LEVOCETIRIZINE DIHYDROCHLORIDE 5 MG PO TABS: 5.0000 mg | ORAL_TABLET | Freq: Every evening | ORAL | 1 refills | Status: DC

## 2019-02-28 MED ORDER — PROMETHAZINE-DM 6.25-15 MG/5ML PO SYRP: 5.0000 mL | ORAL_SOLUTION | Freq: Four times a day (QID) | ORAL | 0 refills | Status: DC | PRN

## 2019-02-28 NOTE — Progress Notes (Signed)
Name: Taylor Schwartz   MRN: [id number]    DOB: November 03, 1980   Date:02/28/2019       Progress Note  Subjective  Chief Complaint  Chief Complaint  Patient presents with  . Cough    wheezing, chest congestion    I connected with  Dell Ponto  on 02/28/19 at  7:40 AM EDT by a video enabled telemedicine application and verified that I am speaking with the correct person using two identifiers.  I discussed the limitations of evaluation and management by telemedicine and the availability of in person appointments. The patient expressed understanding and agreed to proceed. Staff also discussed with the patient that there may be a patient responsible charge related to this service. Patient Location: Home Provider Location: Office Additional Individuals present: None  HPI  Pt presents with concern for respiratory illness -  She has had 2 lumbar disectomies in the last few months, one of these surgeries was emergent, and she mowed the lawn just prior to having this surgery and was exposed to ragweed at that time which is a major allergen for her.  Since surgery she has had cough, nasal congestion, chest congestion, some wheezing intermittently; pseudafed has helped her eye itching. She had negative COVID testing on 02/17/2019 just prior to surgery.  She has not been anywhere since her surgery; she is not allowed to ride in a car or drive yet due to her recent surgeries.  Denies fevers/chills, no known exposure to COVID-19, no GI upset.  She does note that she used her son's albuterol neb and this helped.  She is current smoker, no diagnosis of COPD.  Patient Active Problem List   Diagnosis Date Noted  . Anxiety 05/13/2018  . Poor sleep 05/13/2018  . Hx of malignant melanoma 05/07/2018  . S/P laparoscopic hysterectomy 07/16/2017  . Endometriosis determined by laparoscopy 10/16/2016  . Diarrhea 07/04/2016  . Abdominal pain 07/04/2016  . Complex cyst of left ovary 07/04/2016  . Tobacco  abuse 07/04/2016  . Depression with anxiety 10/17/2014    Social History   Tobacco Use  . Smoking status: Current Every Day Smoker    Packs/day: 0.50    Types: Cigarettes  . Smokeless tobacco: Never Used  Substance Use Topics  . Alcohol use: No     Current Outpatient Medications:  .  vitamin B-12 (CYANOCOBALAMIN) 500 MCG tablet, Take 500 mcg by mouth daily., Disp: , Rfl:  .  hydrOXYzine (VISTARIL) 25 MG capsule, Take 1 capsule (25 mg total) by mouth daily as needed for anxiety. (Patient not taking: Reported on 09/01/2018), Disp: 90 capsule, Rfl: 0 .  tiZANidine (ZANAFLEX) 4 MG tablet, Take 1 tablet (4 mg total) by mouth every 6 (six) hours as needed for muscle spasms. (Patient not taking: Reported on 02/28/2019), Disp: 30 tablet, Rfl: 2  Allergies  Allergen Reactions  . Cyclobenzaprine Itching  . Mobic [Meloxicam] Anaphylaxis  . Tramadol Hives  . Amoxicillin Hives and Itching    Has patient had a PCN reaction causing immediate rash, facial/tongue/throat swelling, SOB or lightheadedness with hypotension: Yes Has patient had a PCN reaction causing severe rash involving mucus membranes or skin necrosis: No Has patient had a PCN reaction that required hospitalization: No Has patient had a PCN reaction occurring within the last 10 years: No If all of the above answers are "NO", then may proceed with Cephalosporin use.    Marland Kitchen Penicillins Hives, Itching and Rash    Has patient had a PCN reaction  causing immediate rash, facial/tongue/throat swelling, SOB or lightheadedness with hypotension: Yes Has patient had a PCN reaction causing severe rash involving mucus membranes or skin necrosis: No Has patient had a PCN reaction that required hospitalization: No Has patient had a PCN reaction occurring within the last 10 years: No If all of the above answers are "NO", then may proceed with Cephalosporin use.     I personally reviewed active problem list, medication list, allergies, notes from  last encounter, lab results with the patient/caregiver today.  ROS  Ten systems reviewed and is negative except as mentioned in HPI  Objective  Virtual encounter, vitals not obtained.  There is no height or weight on file to calculate BMI.  Nursing Note and Vital Signs reviewed.  Physical Exam  Constitutional: Patient appears well-developed and well-nourished. No distress.  HENT: Head: Normocephalic and atraumatic.  Neck: Normal range of motion. Pulmonary/Chest: Effort normal. No respiratory distress. Speaking in complete sentences Neurological: Pt is alert and oriented to person, place, and time. Coordination, speech are normal.  Psychiatric: Patient has a normal mood and affect. behavior is normal. Judgment and thought content normal.  No results found for this or any previous visit (from the past 72 hour(s)).  Assessment & Plan  1. Tobacco abuse - Cessation recommended. She has cut back significantly.  2. Respiratory illness - levocetirizine (XYZAL) 5 MG tablet; Take 1 tablet (5 mg total) by mouth every evening.  Dispense: 30 tablet; Refill: 1 - guaiFENesin (MUCINEX) 600 MG 12 hr tablet; Take 1 tablet (600 mg total) by mouth 2 (two) times daily.  Dispense: 20 tablet; Refill: 0 - promethazine-dextromethorphan (PROMETHAZINE-DM) 6.25-15 MG/5ML syrup; Take 5 mLs by mouth 4 (four) times daily as needed.  Dispense: 118 mL; Refill: 0 - albuterol (PROVENTIL) (2.5 MG/3ML) 0.083% nebulizer solution; Take 3 mLs (2.5 mg total) by nebulization every 6 (six) hours as needed for wheezing or shortness of breath.  Dispense: 150 mL; Refill: 1   -Red flags and when to present for emergency care or RTC including fever >101.23F, chest pain, shortness of breath, new/worsening/un-resolving symptoms, reviewed with patient at time of visit. Follow up and care instructions discussed and provided in AVS. - I discussed the assessment and treatment plan with the patient. The patient was provided an  opportunity to ask questions and all were answered. The patient agreed with the plan and demonstrated an understanding of the instructions.  I provided 13 minutes of non-face-to-face time during this encounter.  Hubbard Hartshorn, FNP

## 2019-03-01 ENCOUNTER — Telehealth: Payer: Self-pay | Admitting: Family Medicine

## 2019-03-01 NOTE — Telephone Encounter (Signed)
Pt called and stated that medication albuterol (PROVENTIL) (2.5 MG/3ML) 0.083% nebulizer solution [id number] was called in and can not use the nebulizer because she just had back surgery. Pt states that she would like the inhaler called in. Please advise

## 2019-03-02 MED ORDER — ALBUTEROL SULFATE HFA 108 (90 BASE) MCG/ACT IN AERS: 2.0000 | INHALATION_SPRAY | RESPIRATORY_TRACT | 1 refills | Status: DC | PRN

## 2019-04-14 ENCOUNTER — Ambulatory Visit: Payer: Medicaid Other | Admitting: Psychology

## 2019-05-17 ENCOUNTER — Encounter: Payer: Medicaid Other | Admitting: Family Medicine

## 2019-08-16 IMAGING — MR MR ABDOMEN WO/W CM MRCP
21 of 23 series · 44 of 48 positions shown · IV contrast (gadavist)
Comparison: Ultrasound 05/14/2018 and CT scans from 4880 and 0679.

CLINICAL DATA: Right upper quadrant abdominal pain. History of
prior cholecystectomy.

EXAM:
MRI ABDOMEN WITHOUT AND WITH CONTRAST (INCLUDING MRCP)
TECHNIQUE: Multiplanar multisequence MR imaging of the abdomen was performed
both before and after the administration of intravenous contrast.
Heavily T2-weighted images of the biliary and pancreatic ducts were
obtained, and three-dimensional MRCP images were rendered by post
processing.
CONTRAST:  6 cc Gadavist

[Series 3: bSSFP · coronal · 6.0mm · 0.74mm/px · 2 of 30 slices shown]
[im 1/30]
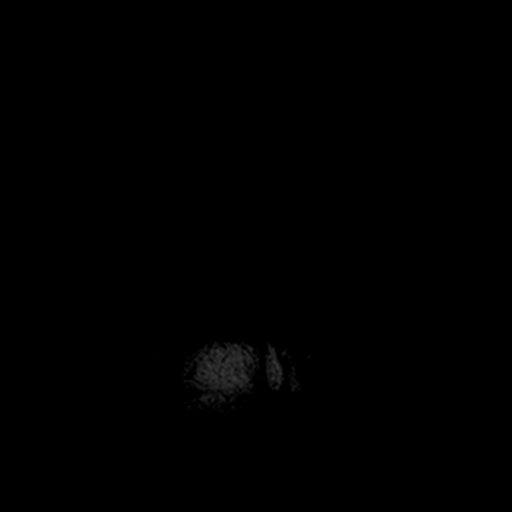
[im 30/30]
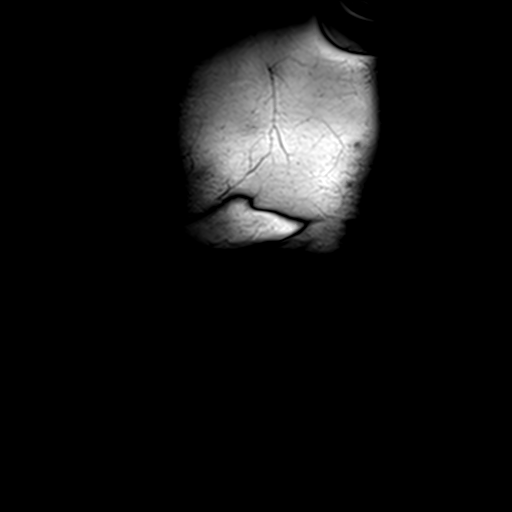

[Series 4: T2 · axial · 6.0mm · 1.19mm/px · z∈[-2,+219]mm · 2 of 32 slices shown]
[im 1/32]
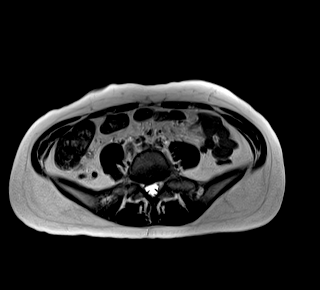
[im 32/32]
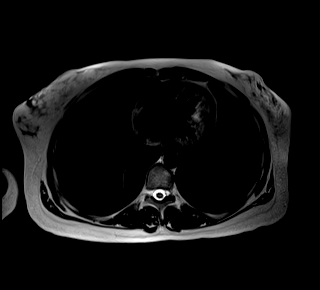

[Series 5: T1 · axial · 6.0mm · 0.74mm/px · z∈[-2,+219]mm · 2 of 32 slices shown (1 of 2)]
[im 1/32]
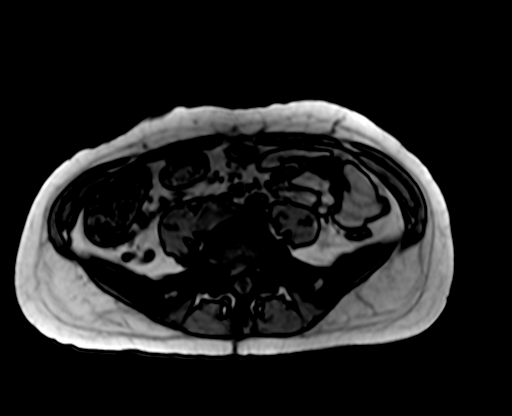
[im 32/32]
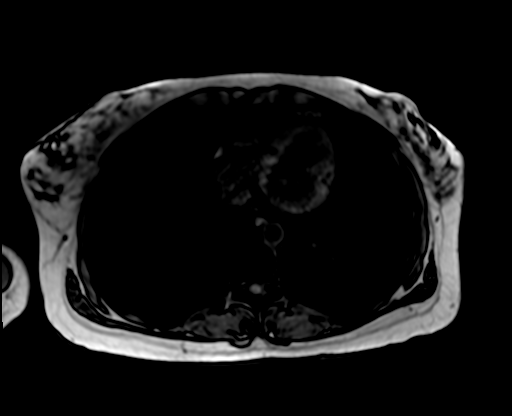

[Series 5: T1 · axial · 6.0mm · 0.74mm/px · 1 of 32 slices shown (2 of 2)]
[im 1/32]
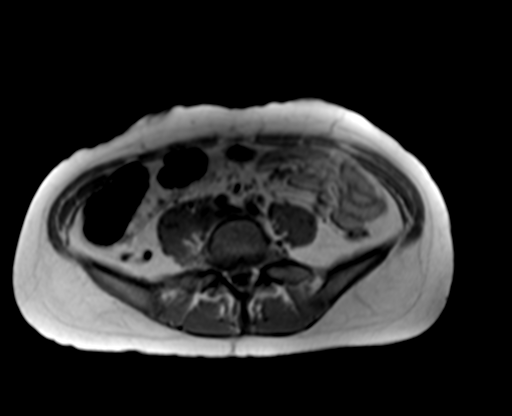

[Series 8: T2 fat-sat · axial · 6.0mm · 1.19mm/px · 1 of 30 slices shown]
[im 1/30]
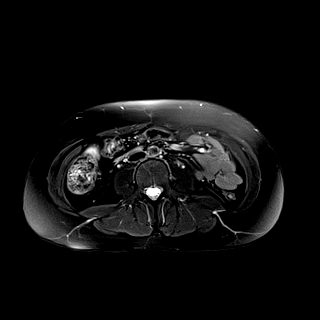

[Series 9: ax dwi_tracew · axial · 6.0mm · 1.42mm/px · 1 of 30 slices shown (1 of 3)]
[im 1/30]
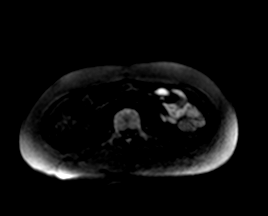

[Series 9: ax dwi_tracew · axial · 6.0mm · 1.42mm/px · 1 of 30 slices shown (2 of 3)]
[im 1/30]
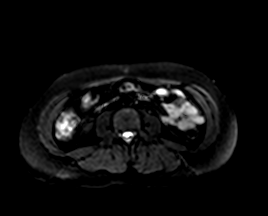

[Series 9: ax dwi_tracew · axial · 6.0mm · 1.42mm/px · 1 of 30 slices shown (3 of 3)]
[im 1/30]
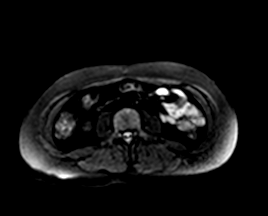

[Series 10: ax dwi_adc · axial · 6.0mm · 1.42mm/px · 1 of 30 slices shown]
[im 1/30]
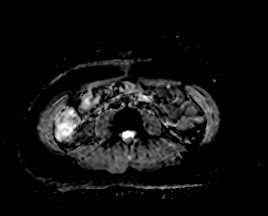

[Series 12: MRCP · coronal · 3.0mm · 1.12mm/px · 1 of 17 slices shown]
[im 1/17]
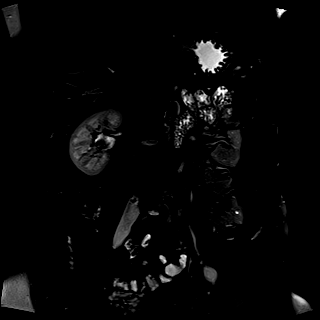

[Series 16: radials · oblique · 50.0mm · 0.78mm/px · 1 of 5 slices shown]
[im 1/5]
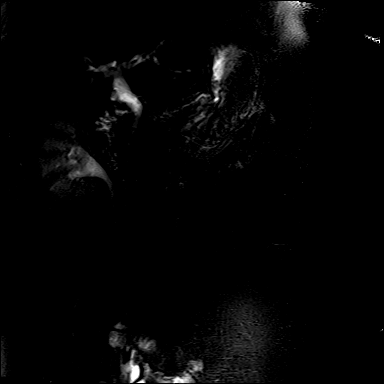

[Series 17: T1 dynamic fat-sat · axial · non-contrast · 3.0mm · 1.19mm/px · z∈[+3,+214]mm · 3 of 72 slices shown (1 of 5)]
[im 1/72]
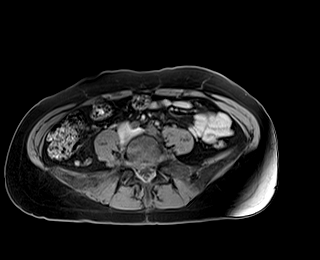
[im 36/72]
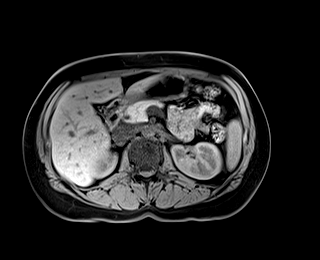
[im 72/72]
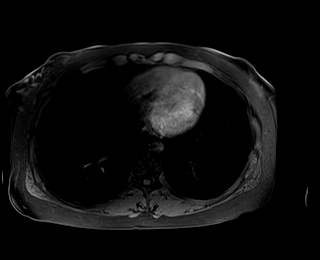

[Series 18: T1 dynamic fat-sat post-contrast · axial · 3.0mm · 1.19mm/px · z∈[+3,+214]mm · 3 of 72 slices shown (1 of 4)]
[im 1/72]
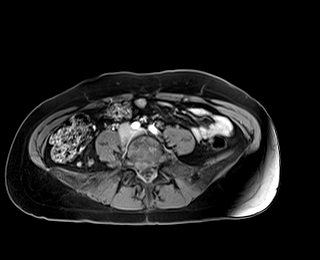
[im 36/72]
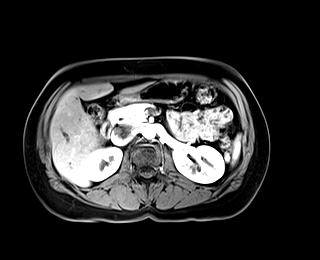
[im 72/72]
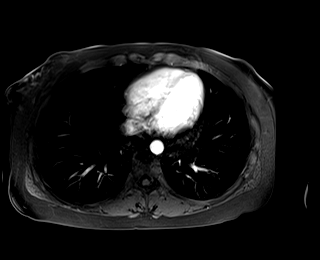

[Series 19: T1 dynamic fat-sat · axial · 3.0mm · 1.19mm/px · z∈[+3,+214]mm · 3 of 72 slices shown (2 of 5)]
[im 1/72]
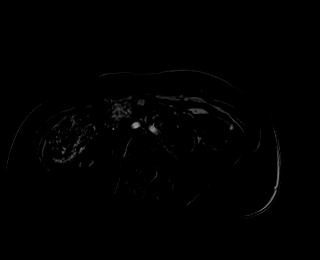
[im 36/72]
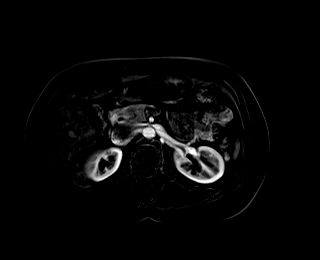
[im 72/72]
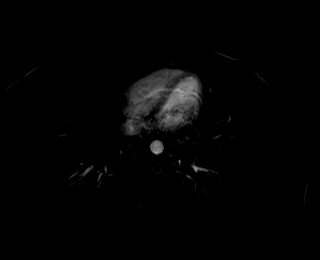

[Series 20: T1 dynamic fat-sat post-contrast · axial · 3.0mm · 1.19mm/px · z∈[+3,+214]mm · 3 of 72 slices shown (2 of 4)]
[im 1/72]
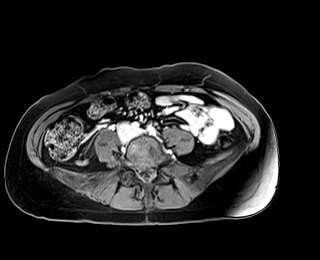
[im 36/72]
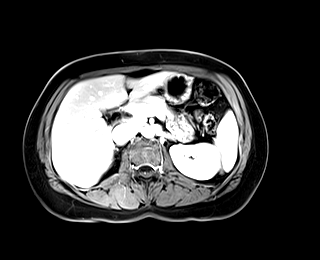
[im 72/72]
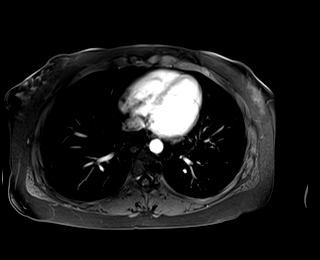

[Series 21: T1 dynamic fat-sat · axial · 3.0mm · 1.19mm/px · z∈[+3,+214]mm · 3 of 72 slices shown (3 of 5)]
[im 1/72]
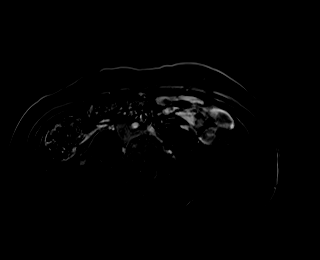
[im 36/72]
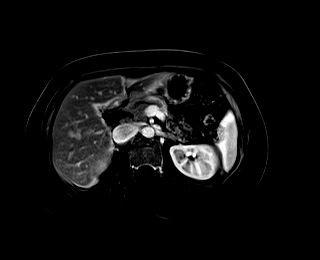
[im 72/72]
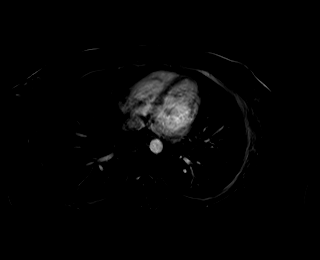

[Series 22: T1 dynamic fat-sat post-contrast · axial · 3.0mm · 1.19mm/px · z∈[+3,+214]mm · 3 of 72 slices shown (3 of 4)]
[im 1/72]
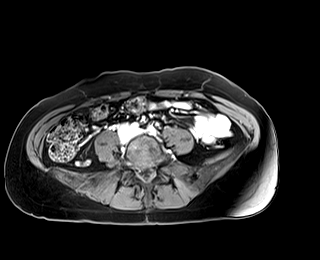
[im 36/72]
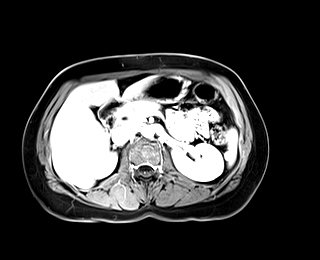
[im 72/72]
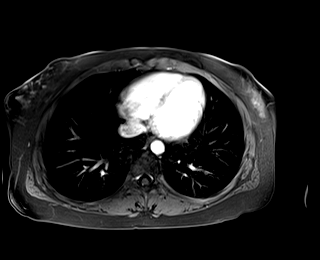

[Series 23: T1 dynamic fat-sat · axial · 3.0mm · 1.19mm/px · z∈[+3,+214]mm · 3 of 72 slices shown (4 of 5)]
[im 1/72]
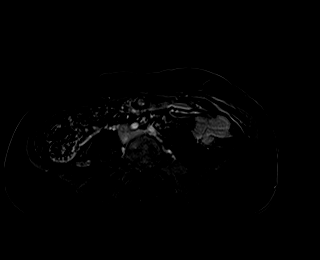
[im 36/72]
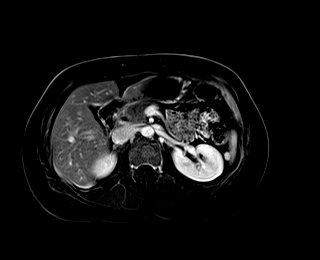
[im 72/72]
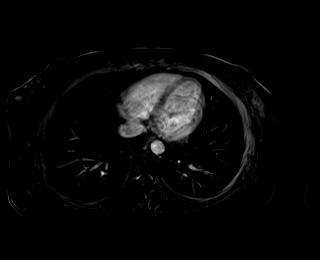

[Series 24: T1 dynamic post-contrast · coronal · 3.0mm · 1.31mm/px · 3 of 72 slices shown]
[im 1/72]
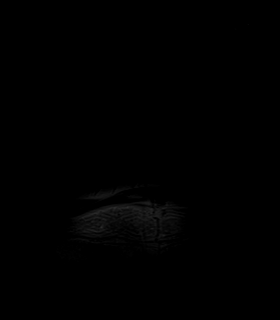
[im 36/72]
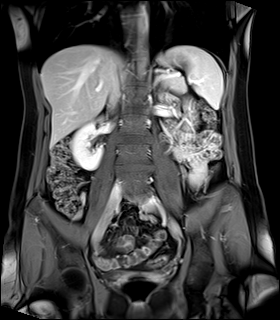
[im 72/72]
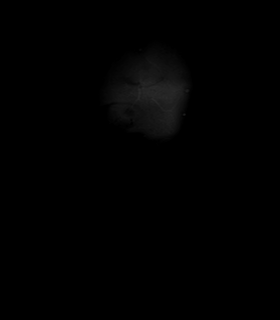

[Series 25: T1 dynamic fat-sat post-contrast · axial · 3.0mm · 1.19mm/px · z∈[+3,+214]mm · 3 of 72 slices shown (4 of 4)]
[im 1/72]
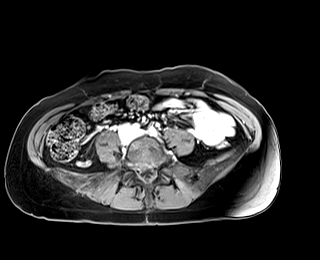
[im 36/72]
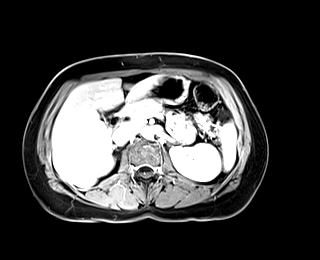
[im 72/72]
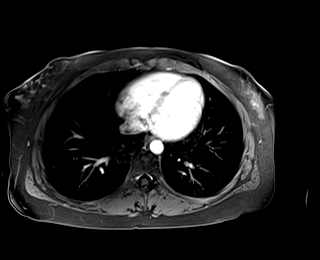

[Series 26: T1 dynamic fat-sat · axial · 3.0mm · 1.19mm/px · z∈[+3,+214]mm · 3 of 72 slices shown (5 of 5)]
[im 1/72]
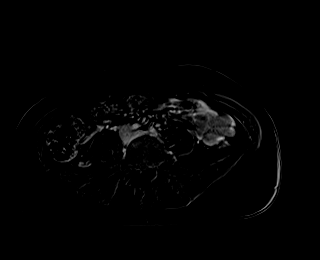
[im 36/72]
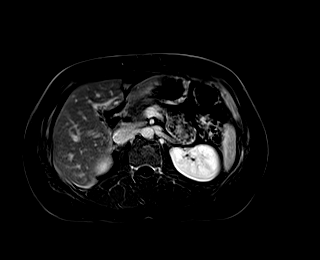
[im 72/72]
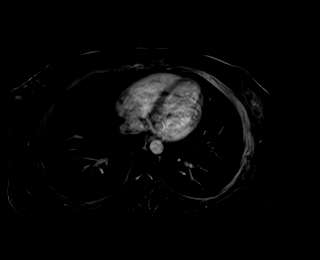

[44 of 48 positions shown; findings below may reference images not displayed]

FINDINGS: Lower chest: The lung bases are grossly clear. No infiltrates or
effusions. No worrisome pulmonary lesions. The heart is normal in
size. No pericardial effusion.

Hepatobiliary: No focal hepatic lesions. Stable mild intra and
extrahepatic biliary dilatation likely due to prior cholecystectomy.
This is unchanged since prior CT scans from 4880 and 0679. No common
bile duct stones are identified.

Pancreas:  No mass, inflammation or ductal dilatation.

Spleen:  Normal size.  No focal lesions.

Adrenals/Urinary Tract: The adrenal glands and kidneys are
unremarkable and stable.

Stomach/Bowel: The stomach, duodenum, visualized small bowel and
visualized colon are unremarkable. No acute inflammatory changes,
mass lesions or obstructive findings. The visualized appendix is
normal.

Vascular/Lymphatic: The aorta and branch vessels are normal. The
major venous structures are patent. No mesenteric or retroperitoneal
mass or adenopathy.

Other:  No ascites or abdominal wall hernia.

Musculoskeletal: No significant bony findings.
IMPRESSION: 1. Stable mild intra and extrahepatic biliary dilatation due to
prior cholecystectomy. No change since prior imaging studies dating
back to 0005.
2. No common bile duct stones, ampullary lesion or pancreatic mass.
Normal caliber main pancreatic duct.
3. No acute abdominal findings, mass lesions or adenopathy.

## 2019-11-10 DIAGNOSIS — Z419 Encounter for procedure for purposes other than remedying health state, unspecified: Secondary | ICD-10-CM | POA: Diagnosis not present

## 2019-12-11 DIAGNOSIS — Z419 Encounter for procedure for purposes other than remedying health state, unspecified: Secondary | ICD-10-CM | POA: Diagnosis not present

## 2020-01-11 DIAGNOSIS — Z419 Encounter for procedure for purposes other than remedying health state, unspecified: Secondary | ICD-10-CM | POA: Diagnosis not present

## 2020-02-10 DIAGNOSIS — Z419 Encounter for procedure for purposes other than remedying health state, unspecified: Secondary | ICD-10-CM | POA: Diagnosis not present

## 2020-03-04 ENCOUNTER — Encounter: Payer: Self-pay | Admitting: Family Medicine

## 2020-03-12 DIAGNOSIS — Z419 Encounter for procedure for purposes other than remedying health state, unspecified: Secondary | ICD-10-CM | POA: Diagnosis not present

## 2020-03-27 ENCOUNTER — Encounter: Payer: Self-pay | Admitting: Family Medicine

## 2020-03-27 ENCOUNTER — Ambulatory Visit (INDEPENDENT_AMBULATORY_CARE_PROVIDER_SITE_OTHER): Payer: Medicaid Other | Admitting: Family Medicine

## 2020-03-27 ENCOUNTER — Other Ambulatory Visit: Payer: Self-pay

## 2020-03-27 VITALS — BP 110/76 | HR 81 | Temp 98.3°F | Resp 16 | Ht 67.0 in | Wt 138.4 lb

## 2020-03-27 DIAGNOSIS — J302 Other seasonal allergic rhinitis: Secondary | ICD-10-CM

## 2020-03-27 DIAGNOSIS — F331 Major depressive disorder, recurrent, moderate: Secondary | ICD-10-CM

## 2020-03-27 DIAGNOSIS — Z0000 Encounter for general adult medical examination without abnormal findings: Secondary | ICD-10-CM

## 2020-03-27 DIAGNOSIS — Z114 Encounter for screening for human immunodeficiency virus [HIV]: Secondary | ICD-10-CM | POA: Diagnosis not present

## 2020-03-27 DIAGNOSIS — Z716 Tobacco abuse counseling: Secondary | ICD-10-CM

## 2020-03-27 DIAGNOSIS — Z1231 Encounter for screening mammogram for malignant neoplasm of breast: Secondary | ICD-10-CM | POA: Diagnosis not present

## 2020-03-27 DIAGNOSIS — Z1159 Encounter for screening for other viral diseases: Secondary | ICD-10-CM

## 2020-03-27 DIAGNOSIS — L989 Disorder of the skin and subcutaneous tissue, unspecified: Secondary | ICD-10-CM | POA: Diagnosis not present

## 2020-03-27 MED ORDER — LEVOCETIRIZINE DIHYDROCHLORIDE 5 MG PO TABS: 5.0000 mg | ORAL_TABLET | Freq: Every evening | ORAL | 5 refills | Status: DC

## 2020-03-27 NOTE — Patient Instructions (Addendum)
[facility] at [place],    [id number] Get Driving Directions Main: [phone] Call to schedule your mammogram  Preventive Care 82-37 Years Old, Female Preventive care refers to visits with your health care provider and lifestyle choices that can promote health and wellness. This includes:  A yearly physical exam. This may also be called an annual well check.  Regular dental visits and eye exams.  Immunizations.  Screening for certain conditions.  Healthy lifestyle choices, such as eating a healthy diet, getting regular exercise, not using drugs or products that contain nicotine and tobacco, and limiting alcohol use. What can I expect for my preventive care visit? Physical exam Your health care provider will check your:  Height and weight. This may be used to calculate body mass index (BMI), which tells if you are at a healthy weight.  Heart rate and blood pressure.  Skin for abnormal spots. Counseling Your health care provider may ask you questions about your:  Alcohol, tobacco, and drug use.  Emotional well-being.  Home and relationship well-being.  Sexual activity.  Eating habits.  Work and work Statistician.  Method of birth control.  Menstrual cycle.  Pregnancy history. What immunizations do I need?  Influenza (flu) vaccine  This is recommended every year. Tetanus, diphtheria, and pertussis (Tdap) vaccine  You may need a Td booster every 10 years. Varicella (chickenpox) vaccine  You may need this if you have not been vaccinated. Human papillomavirus (HPV) vaccine  If recommended by your health care provider, you may need three doses over 6 months. Measles, mumps, and rubella (MMR) vaccine  You may need at least one dose of MMR. You may also need a second dose. Meningococcal conjugate (MenACWY) vaccine  One dose is recommended if you are age 37-21 years and a first-year college student living  in a residence hall, or if you have one of several medical conditions. You may also need additional booster doses. Pneumococcal conjugate (PCV13) vaccine  You may need this if you have certain conditions and were not previously vaccinated. Pneumococcal polysaccharide (PPSV23) vaccine  You may need one or two doses if you smoke cigarettes or if you have certain conditions. Hepatitis A vaccine  You may need this if you have certain conditions or if you travel or work in places where you may be exposed to hepatitis A. Hepatitis B vaccine  You may need this if you have certain conditions or if you travel or work in places where you may be exposed to hepatitis B. Haemophilus influenzae type b (Hib) vaccine  You may need this if you have certain conditions. You may receive vaccines as individual doses or as more than one vaccine together in one shot (combination vaccines). Talk with your health care provider about the risks and benefits of combination vaccines. What tests do I need?  Blood tests  Lipid and cholesterol levels. These may be checked every 5 years starting at age 47.  Hepatitis C test.  Hepatitis B test. Screening  Diabetes screening. This is done by checking your blood sugar (glucose) after you have not eaten for a while (fasting).  Sexually transmitted disease (STD) testing.  BRCA-related cancer screening. This may be done if you have a family history of breast, ovarian, tubal, or peritoneal cancers.  Pelvic exam and Pap test. This may be done every 3 years starting at age 11. Starting at age 78, this may be done every 5 years if you have a Pap  test in combination with an HPV test. Talk with your health care provider about your test results, treatment options, and if necessary, the need for more tests. Follow these instructions at home: Eating and drinking   Eat a diet that includes fresh fruits and vegetables, whole grains, lean protein, and low-fat dairy.  Take  vitamin and mineral supplements as recommended by your health care provider.  Do not drink alcohol if: ? Your health care provider tells you not to drink. ? You are pregnant, may be pregnant, or are planning to become pregnant.  If you drink alcohol: ? Limit how much you have to 0-1 drink a day. ? Be aware of how much alcohol is in your drink. In the [place] one drink equals one 12 oz bottle of beer (355 mL), one 5 oz glass of wine (148 mL), or one 1 oz glass of hard liquor (44 mL). Lifestyle  Take daily care of your teeth and gums.  Stay active. Exercise for at least 30 minutes on 5 or more days each week.  Do not use any products that contain nicotine or tobacco, such as cigarettes, e-cigarettes, and chewing tobacco. If you need help quitting, ask your health care provider.  If you are sexually active, practice safe sex. Use a condom or other form of birth control (contraception) in order to prevent pregnancy and STIs (sexually transmitted infections). If you plan to become pregnant, see your health care provider for a preconception visit. What's next?  Visit your health care provider once a year for a well check visit.  Ask your health care provider how often you should have your eyes and teeth checked.  Stay up to date on all vaccines. This information is not intended to replace advice given to you by your health care provider. Make sure you discuss any questions you have with your health care provider. Document Revised: 01/07/2018 Document Reviewed: 01/07/2018 Elsevier Patient Education  2020 [employer].   Steps to Quit Smoking Smoking tobacco is the leading cause of preventable death. It can affect almost every organ in the body. Smoking puts you and people around you at risk for many serious, long-lasting (chronic) diseases. Quitting smoking can be hard, but it is one of the best things that you can do for your health. It is never too late to quit. How do I get ready to  quit? When you decide to quit smoking, make a plan to help you succeed. Before you quit:  Pick a date to quit. Set a date within the next 2 weeks to give you time to prepare.  Write down the reasons why you are quitting. Keep this list in places where you will see it often.  Tell your family, friends, and co-workers that you are quitting. Their support is important.  Talk with your doctor about the choices that may help you quit.  Find out if your health insurance will pay for these treatments.  Know the people, places, things, and activities that make you want to smoke (triggers). Avoid them. What first steps can I take to quit smoking?  Throw away all cigarettes at home, at work, and in your car.  Throw away the things that you use when you smoke, such as ashtrays and lighters.  Clean your car. Make sure to empty the ashtray.  Clean your home, including curtains and carpets. What can I do to help me quit smoking? Talk with your doctor about taking medicines and seeing a counselor at the  same time. You are more likely to succeed when you do both.  If you are pregnant or breastfeeding, talk with your doctor about counseling or other ways to quit smoking. Do not take medicine to help you quit smoking unless your doctor tells you to do so. To quit smoking: Quit right away  Quit smoking totally, instead of slowly cutting back on how much you smoke over a period of time.  Go to counseling. You are more likely to quit if you go to counseling sessions regularly. Take medicine You may take medicines to help you quit. Some medicines need a prescription, and some you can buy over-the-counter. Some medicines may contain a drug called nicotine to replace the nicotine in cigarettes. Medicines may:  Help you to stop having the desire to smoke (cravings).  Help to stop the problems that come when you stop smoking (withdrawal symptoms). Your doctor may ask you to use:  Nicotine patches,  gum, or lozenges.  Nicotine inhalers or sprays.  Non-nicotine medicine that is taken by mouth. Find resources Find resources and other ways to help you quit smoking and remain smoke-free after you quit. These resources are most helpful when you use them often. They include:  Online chats with a Social worker.  Phone quitlines.  Printed Furniture conservator/restorer.  Support groups or group counseling.  Text messaging programs.  Mobile phone apps. Use apps on your mobile phone or tablet that can help you stick to your quit plan. There are many free apps for mobile phones and tablets as well as websites. Examples include Quit Guide from the [employer] and smokefree.gov  What things can I do to make it easier to quit?   Talk to your family and friends. Ask them to support and encourage you.  Call a phone quitline (1-800-QUIT-NOW), reach out to support groups, or work with a Social worker.  Ask people who smoke to not smoke around you.  Avoid places that make you want to smoke, such as: ? Bars. ? Parties. ? Smoke-break areas at work.  Spend time with people who do not smoke.  Lower the stress in your life. Stress can make you want to smoke. Try these things to help your stress: ? Getting regular exercise. ? Doing deep-breathing exercises. ? Doing yoga. ? Meditating. ? Doing a body scan. To do this, close your eyes, focus on one area of your body at a time from head to toe. Notice which parts of your body are tense. Try to relax the muscles in those areas. How will I feel when I quit smoking? Day 1 to 3 weeks Within the first 24 hours, you may start to have some problems that come from quitting tobacco. These problems are very bad 2-3 days after you quit, but they do not often last for more than 2-3 weeks. You may get these symptoms:  Mood swings.  Feeling restless, nervous, angry, or annoyed.  Trouble concentrating.  Dizziness.  Strong desire for high-sugar foods and nicotine.  Weight  gain.  Trouble pooping (constipation).  Feeling like you may vomit (nausea).  Coughing or a sore throat.  Changes in how the medicines that you take for other issues work in your body.  Depression.  Trouble sleeping (insomnia). Week 3 and afterward After the first 2-3 weeks of quitting, you may start to notice more positive results, such as:  Better sense of smell and taste.  Less coughing and sore throat.  Slower heart rate.  Lower blood pressure.  Clearer skin.  Better breathing.  Fewer sick days. Quitting smoking can be hard. Do not give up if you fail the first time. Some people need to try a few times before they succeed. Do your best to stick to your quit plan, and talk with your doctor if you have any questions or concerns. Summary  Smoking tobacco is the leading cause of preventable death. Quitting smoking can be hard, but it is one of the best things that you can do for your health.  When you decide to quit smoking, make a plan to help you succeed.  Quit smoking right away, not slowly over a period of time.  When you start quitting, seek help from your doctor, family, or friends. This information is not intended to replace advice given to you by your health care provider. Make sure you discuss any questions you have with your health care provider. Document Revised: 01/21/2019 Document Reviewed: 07/17/2018 Elsevier Patient Education  2020 [employer]  

## 2020-03-27 NOTE — Progress Notes (Signed)
Patient: Taylor Schwartz, Female    DOB: [birth year], 39 y.o.   MRN: [id number] Delsa Grana, PA-C Visit Date: 03/27/2020  Today's Provider: Delsa Grana, PA-C   Chief Complaint  Patient presents with  . Annual Exam   Subjective:   Pt new to me, last appt in office was 07/2018 Here for CPE Last labs done 2019  Annual physical exam:  Taylor Schwartz is a 39 y.o. female who presents today for complete physical exam:  Exercise/Activity:  Most days 6-7 walks 3 miles Diet/nutrition:  Good nutrition, balanced, really only eats one big meal a day Sleep:  Chronic poor sleep, trouble falling asleep and trouble staying asleep   USPSTF grade A and B recommendations - reviewed and addressed today  Depression:  Phq 9 completed today by patient, was reviewed by me with patient in the room PHQ score is positive PHQ 2/9 Scores 03/27/2020 02/28/2019 09/01/2018 08/02/2018  PHQ - 2 Score 4 0 0 0  PHQ- 9 Score 9 0 0 3   Depression screen [place] 2/9 03/27/2020 02/28/2019 09/01/2018 08/02/2018 07/01/2018  Decreased Interest 2 0 0 0 0  Down, Depressed, Hopeless 2 0 0 0 0  PHQ - 2 Score 4 0 0 0 0  Altered sleeping 3 0 0 3 0  Tired, decreased energy 0 0 0 0 0  Change in appetite 0 0 0 0 0  Feeling bad or failure about yourself  0 0 0 0 0  Trouble concentrating 2 0 0 0 0  Moving slowly or fidgety/restless 0 0 0 0 0  Suicidal thoughts 0 0 0 0 0  PHQ-9 Score 9 0 0 3 0  Difficult doing work/chores Somewhat difficult Not difficult at all Not difficult at all Not difficult at all Not difficult at all    Alcohol screening:   [facility] Visit from 03/27/2020 in [facility]  AUDIT-C Score 0      Immunizations and Health Maintenance: Health Maintenance  Topic Date Due  . Hepatitis C Screening  Never done  . HIV Screening  Never done  . COVID-19 Vaccine (1) 04/12/2020 (Originally 03/06/1995)  . INFLUENZA VACCINE  08/09/2020 (Originally 12/11/2019)  . TETANUS/TDAP  05/12/2024  .  PAP SMEAR-Modifier  Discontinued     Hep C Screening: due  STD testing and prevention (HIV/chl/gon/syphilis): due   Intimate partner violence:denies  Sexual History/Pain during Intercourse: Significant Other- same female partner  Menstrual History/LMP/Abnormal Bleeding: hysterectomy- no AUB Patient's last menstrual period was 06/30/2017 (approximate).  Incontinence Symptoms: denies   Breast cancer: past abnormal mammogram, doesn't know if or when to repeat - has a marker and had biopsy Last Mammogram: *see HM list above BRCA gene screening: unknown  Cervical cancer screening  N/a TAH - unilateral oophrectomy  Pt has positive family hx of cancers - breast, ovarian, uterine, colon:    Ovarian CA in mother  Osteoporosis:   Discussion on osteoporosis per age, including high calcium and vitamin D supplementation, weight bearing exercises  Skin cancer:  Hx of skin CA -  Prior mole removed Discussed atypical lesions   Colorectal cancer:   Colonoscopy is not due per age Discussed concerning signs and sx of CRC, pt denies change in bowels, melena  Lung cancer:   Low Dose CT Chest recommended if Age 9-80 years, 30 pack-year currently smoking OR have quit w/in 15years. Patient does not qualify.    Social History   Tobacco Use  . Smoking status: Current Every Day  Smoker    Packs/day: 0.50    Types: Cigarettes  . Smokeless tobacco: Never Used  Vaping Use  . Vaping Use: Never used  Substance Use Topics  . Alcohol use: No  . Drug use: No    Smoking cessation instruction/counseling given:  counseled patient on the dangers of tobacco use, advised patient to stop smoking, and reviewed strategies to maximize success     Office Visit from 03/27/2020 in [facility]  AUDIT-C Score 0      Family History  Problem Relation Age of Onset  . Lupus Mother   . Arthritis Mother   . Glaucoma Mother   . Vision loss Mother   . Diabetes Mother   . Ovarian cancer  Mother 51  . Diabetes Father   . Prostate cancer Father 39  . Diabetes Maternal Grandmother   . Dementia Maternal Grandmother   . Diabetes Maternal Grandfather   . Pneumonia Maternal Grandfather   . Diabetes Paternal Grandmother   . Diabetes Paternal Grandfather   . Autism Son      Blood pressure/Hypertension: BP Readings from Last 3 Encounters:  03/27/20 110/76  08/02/18 118/70  07/01/18 112/68    Weight/Obesity: Wt Readings from Last 3 Encounters:  03/27/20 138 lb 6.4 oz (62.8 kg)  09/01/18 134 lb (60.8 kg)  08/02/18 139 lb 1.6 oz (63.1 kg)   BMI Readings from Last 3 Encounters:  03/27/20 21.68 kg/m  09/01/18 20.99 kg/m  08/02/18 21.79 kg/m     Lipids:  Lab Results  Component Value Date   CHOL 162 05/07/2018   Lab Results  Component Value Date   HDL 54 05/07/2018   Lab Results  Component Value Date   LDLCALC 88 05/07/2018   Lab Results  Component Value Date   TRIG 100 05/07/2018   Lab Results  Component Value Date   CHOLHDL 3.0 05/07/2018   No results found for: LDLDIRECT Based on the results of lipid panel his/her cardiovascular risk factor ( using [place] )  in the next 10 years is: The ASCVD Risk score Mikey Bussing DC Jr., et al., 2013) failed to calculate for the following reasons:   The 2013 ASCVD risk score is only valid for ages 22 to 31 Glucose:  Glucose, Bld  Date Value Ref Range Status  05/07/2018 76 65 - 99 mg/dL Final    Comment:    .            Fasting reference interval .   07/17/2017 147 (H) 65 - 99 mg/dL Final  07/10/2017 80 65 - 99 mg/dL Final   Hypertension: BP Readings from Last 3 Encounters:  03/27/20 110/76  08/02/18 118/70  07/01/18 112/68   Obesity: Wt Readings from Last 3 Encounters:  03/27/20 138 lb 6.4 oz (62.8 kg)  09/01/18 134 lb (60.8 kg)  08/02/18 139 lb 1.6 oz (63.1 kg)   BMI Readings from Last 3 Encounters:  03/27/20 21.68 kg/m  09/01/18 20.99 kg/m  08/02/18 21.79 kg/m      Advanced Care  Planning:  A voluntary discussion about advance care planning including the explanation and discussion of advance directives.    Social History      She        Social History   Socioeconomic History  . Marital status: Significant Other    Spouse name: Duard Brady  . Number of children: 1  . Years of education: 76  . Highest education level: Associate degree: academic program  Occupational History  .  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 Occupation: full time student  Tobacco Use  . Smoking status: Current Every Day Smoker    Packs/day: 0.50    Types: Cigarettes  . Smokeless tobacco: Never Used  Vaping Use  . Vaping Use: Never used  Substance and Sexual Activity  . Alcohol use: No  . Drug use: No  . Sexual activity: Yes    Partners: Male    Birth control/protection: None  Other Topics Concern  . Not on file  Social History Narrative   Walks 5-6 miles everyday   Social Determinants of Health   Financial Resource Strain: Medium Risk  . Difficulty of Paying Living Expenses: Somewhat hard  Food Insecurity: Food Insecurity Present  . Worried About Charity fundraiser in the Last Year: Sometimes true  . Ran Out of Food in the Last Year: Sometimes true  Transportation Needs: No Transportation Needs  . Lack of Transportation (Medical): No  . Lack of Transportation (Non-Medical): No  Physical Activity: Sufficiently Active  . Days of Exercise per Week: 7 days  . Minutes of Exercise per Session: 80 min  Stress: Stress Concern Present  . Feeling of Stress : Rather much  Social Connections: Socially Isolated  . Frequency of Communication with Friends and Family: Three times a week  . Frequency of Social Gatherings with Friends and Family: Never  . Attends Religious Services: Never  . Active Member of Clubs or Organizations: No  . Attends Archivist Meetings: Never  . Marital Status: Never married    Family History        Family History  Problem Relation Age of  Onset  . Lupus Mother   . Arthritis Mother   . Glaucoma Mother   . Vision loss Mother   . Diabetes Mother   . Ovarian cancer Mother 38  . Diabetes Father   . Prostate cancer Father 41  . Diabetes Maternal Grandmother   . Dementia Maternal Grandmother   . Diabetes Maternal Grandfather   . Pneumonia Maternal Grandfather   . Diabetes Paternal Grandmother   . Diabetes Paternal Grandfather   . Autism Son     Patient Active Problem List   Diagnosis Date Noted  . Lumbar radiculopathy 10/27/2018  . Anxiety 05/13/2018  . Poor sleep 05/13/2018  . Hx of malignant melanoma 05/07/2018  . S/P laparoscopic hysterectomy 07/16/2017  . Endometriosis determined by laparoscopy 10/16/2016  . Diarrhea 07/04/2016  . Abdominal pain 07/04/2016  . Complex cyst of left ovary 07/04/2016  . Tobacco abuse 07/04/2016  . Fibrocystic changes of left breast 01/28/2016  . Cervical radiculopathy 07/06/2015  . Compound nevus 07/06/2015  . Esophageal reflux 07/06/2015  . Recurrent major depression ([place]) 07/06/2015  . Depression with anxiety 10/17/2014    Past Surgical History:  Procedure Laterality Date  . BREAST SURGERY Left 05/2016   benign. metal clip to identify area when mammograms  . CHOLECYSTECTOMY  2008  . CYSTOSCOPY N/A 07/16/2017   Procedure: CYSTOSCOPY;  Surgeon: Malachy Mood, MD;  Location: [facility] ORS;  Service: Gynecology;  Laterality: N/A;  . LAPAROSCOPIC HYSTERECTOMY Bilateral 07/16/2017   Procedure: HYSTERECTOMY TOTAL LAPAROSCOPIC,LEFT SALPINGO OPHORECTOMY,RIGHT SALPINGECTOMY;  Surgeon: Malachy Mood, MD;  Location: [facility] ORS;  Service: Gynecology;  Laterality: Bilateral;  . LAPAROSCOPIC OVARIAN CYSTECTOMY Left 10/09/2016   Procedure: LAPAROSCOPIC OVARIAN CYSTECTOMY;  Surgeon: Malachy Mood, MD;  Location: [facility] ORS;  Service: Gynecology;  Laterality: Left;  . mole removed  2016   area under breast removed  . nsvd  2007     Current Outpatient Medications:  .  albuterol  (PROVENTIL) (2.5 MG/3ML) 0.083% nebulizer solution, Take 3 mLs (2.5 mg total) by nebulization every 6 (six) hours as needed for wheezing or shortness of breath. (Patient not taking: Reported on 03/27/2020), Disp: 150 mL, Rfl: 1 .  albuterol (VENTOLIN HFA) 108 (90 Base) MCG/ACT inhaler, Inhale 2 puffs into the lungs every 4 (four) hours as needed for wheezing or shortness of breath. (Patient not taking: Reported on 03/27/2020), Disp: 18 g, Rfl: 1 .  guaiFENesin (MUCINEX) 600 MG 12 hr tablet, Take 1 tablet (600 mg total) by mouth 2 (two) times daily. (Patient not taking: Reported on 03/27/2020), Disp: 20 tablet, Rfl: 0 .  levocetirizine (XYZAL) 5 MG tablet, Take 1 tablet (5 mg total) by mouth every evening. (Patient not taking: Reported on 03/27/2020), Disp: 30 tablet, Rfl: 1 .  promethazine-dextromethorphan (PROMETHAZINE-DM) 6.25-15 MG/5ML syrup, Take 5 mLs by mouth 4 (four) times daily as needed. (Patient not taking: Reported on 03/27/2020), Disp: 118 mL, Rfl: 0 .  vitamin B-12 (CYANOCOBALAMIN) 500 MCG tablet, Take 500 mcg by mouth daily. (Patient not taking: Reported on 03/27/2020), Disp: , Rfl:   Allergies  Allergen Reactions  . Cyclobenzaprine Itching  . Mobic [Meloxicam] Anaphylaxis  . Tramadol Hives  . Amoxicillin Hives and Itching    Has patient had a PCN reaction causing immediate rash, facial/tongue/throat swelling, SOB or lightheadedness with hypotension: Yes Has patient had a PCN reaction causing severe rash involving mucus membranes or skin necrosis: No Has patient had a PCN reaction that required hospitalization: No Has patient had a PCN reaction occurring within the last 10 years: No If all of the above answers are "NO", then may proceed with Cephalosporin use.    Marland Kitchen Penicillins Hives, Itching and Rash    Has patient had a PCN reaction causing immediate rash, facial/tongue/throat swelling, SOB or lightheadedness with hypotension: Yes Has patient had a PCN reaction causing severe  rash involving mucus membranes or skin necrosis: No Has patient had a PCN reaction that required hospitalization: No Has patient had a PCN reaction occurring within the last 10 years: No If all of the above answers are "NO", then may proceed with Cephalosporin use.     Patient Care Team: Delsa Grana, PA-C as PCP - General (Family Medicine)  Review of Systems  10 Systems reviewed and are negative for acute change except as noted in the HPI.   I personally reviewed active problem list, medication list, allergies, family history, social history, health maintenance, notes from last encounter, lab results, imaging with the patient/caregiver today.        Objective:   Vitals:  Vitals:   03/27/20 1114  BP: 110/76  Pulse: 81  Resp: 16  Temp: 98.3 F (36.8 C)  TempSrc: Oral  SpO2: 98%  Weight: 138 lb 6.4 oz (62.8 kg)  Height: _0  (1.702 m)    Body mass index is 21.68 kg/m.  Physical Exam Vitals and nursing note reviewed.  Constitutional:      General: She is not in acute distress.    Appearance: Normal appearance. She is well-developed and normal weight. She is not ill-appearing, toxic-appearing or diaphoretic.  HENT:     Head: Normocephalic and atraumatic.     Comments: Wig/weave in place    Right Ear: Tympanic membrane, ear canal and external ear normal. There is no impacted cerumen.     Left Ear: Tympanic membrane, ear canal and external ear normal.  Nose: Congestion present.     Mouth/Throat:     Mouth: Mucous membranes are moist.     Pharynx: Oropharynx is clear. Uvula midline. No oropharyngeal exudate or posterior oropharyngeal erythema.  Eyes:     General: Lids are normal. No scleral icterus.       Right eye: No discharge.        Left eye: No discharge.     Conjunctiva/sclera: Conjunctivae normal.     Pupils: Pupils are equal, round, and reactive to light.  Neck:     Trachea: Phonation normal. No tracheal deviation.  Cardiovascular:     Rate and Rhythm:  Normal rate and regular rhythm.     Pulses: Normal pulses.          Radial pulses are 2+ on the right side and 2+ on the left side.       Posterior tibial pulses are 2+ on the right side and 2+ on the left side.     Heart sounds: Normal heart sounds. No murmur heard.  No friction rub. No gallop.   Pulmonary:     Effort: Pulmonary effort is normal. No respiratory distress.     Breath sounds: Normal breath sounds. No stridor. No wheezing, rhonchi or rales.  Chest:     Chest wall: No tenderness.  Abdominal:     General: Bowel sounds are normal. There is no distension.     Palpations: Abdomen is soft.     Tenderness: There is no abdominal tenderness. There is no right CVA tenderness, left CVA tenderness, guarding or rebound.  Musculoskeletal:        General: No deformity. Normal range of motion.     Cervical back: Normal range of motion and neck supple.  Lymphadenopathy:     Cervical: No cervical adenopathy.  Skin:    General: Skin is warm and dry.     Capillary Refill: Capillary refill takes less than 2 seconds.     Coloration: Skin is not pale.     Findings: Lesion (multiple brown round raised lesions to abdomen less than 1 cm in diameter) present. No rash.  Neurological:     Mental Status: She is alert. Mental status is at baseline.     Motor: No abnormal muscle tone.     Gait: Gait normal.  Psychiatric:        Mood and Affect: Mood normal.        Speech: Speech normal.        Behavior: Behavior normal.       Fall Risk: Fall Risk  03/27/2020 02/28/2019 09/01/2018 08/02/2018 07/01/2018  Falls in the past year? 0 0 0 0 0  Number falls in past yr: 0 0 0 - 0  Injury with Fall? 0 0 0 - 0  Follow up Falls evaluation completed Falls evaluation completed - - -    Functional Status Survey: Is the patient deaf or have difficulty hearing?: No Does the patient have difficulty seeing, even when wearing glasses/contacts?: No Does the patient have difficulty concentrating, remembering,  or making decisions?: No Does the patient have difficulty walking or climbing stairs?: No Does the patient have difficulty dressing or bathing?: No Does the patient have difficulty doing errands alone such as visiting a doctor's office or shopping?: No   Assessment & Plan:    CPE completed today  . USPSTF grade A and B recommendations reviewed with patient; age-appropriate recommendations, preventive care, screening tests, etc discussed and encouraged; healthy living encouraged; see AVS  for patient education given to patient  . Discussed importance of 150 minutes of physical activity weekly, AHA exercise recommendations given to pt in AVS/handout  . Discussed importance of healthy diet:  eating lean meats and proteins, avoiding trans fats and saturated fats, avoid simple sugars and excessive carbs in diet, eat 6 servings of fruit/vegetables daily and drink plenty of water and avoid sweet beverages.    . Recommended pt to do annual eye exam and routine dental exams/cleanings  . Depression, alcohol, fall screening completed as documented above and per flowsheets  . Reviewed Health Maintenance: Health Maintenance  Topic Date Due  . Hepatitis C Screening  Never done  . HIV Screening  Never done  . COVID-19 Vaccine (1) 04/12/2020 (Originally 03/06/1995)  . INFLUENZA VACCINE  08/09/2020 (Originally 12/11/2019)  . TETANUS/TDAP  05/12/2024  . PAP SMEAR-Modifier  Discontinued    . Immunizations: Immunization History  Administered Date(s) Administered  . Tdap 02/09/2010     ICD-10-CM   1. Adult general medical exam  Z00.00 CBC with Differential/Platelet    COMPLETE METABOLIC PANEL WITH GFR    Lipid panel    HIV Antibody (routine testing w rflx)    Hepatitis C antibody  2. Screening for HIV without presence of risk factors  Z11.4 HIV Antibody (routine testing w rflx)  3. Encounter for hepatitis C screening test for low risk patient  Z11.59 Hepatitis C antibody  4. Encounter for  screening mammogram for malignant neoplasm of breast  Z12.31 MM 3D SCREEN BREAST BILATERAL  5. Moderate episode of recurrent major depressive disorder (HCC) Chronic F33.1   6. Seasonal allergic rhinitis, unspecified trigger  J30.2 levocetirizine (XYZAL) 5 MG tablet  7. Skin lesions  L98.9 Ambulatory referral to Dermatology   multiple raised lesions, round, dark brown, to abd, pt reports prior biopsy and skin CA - no records available, est with local dermatology  8. Encounter for smoking cessation counseling  Z71.6    counseled on quiting, offered several resources and available medications, more than 5 min spent discussing today     Delsa Grana, PA-C 03/27/20 11:30 AM  [place]

## 2020-03-28 LAB — HEPATITIS C ANTIBODY
Hepatitis C Ab: NONREACTIVE
SIGNAL TO CUT-OFF: 0.01 (ref <1.00)

## 2020-03-28 LAB — CBC WITH DIFFERENTIAL/PLATELET
Absolute Monocytes: 603 cells/uL (ref 200–950)
Basophils Absolute: 87 cells/uL (ref 0–200)
Basophils Relative: 1.3 %
Eosinophils Absolute: 429 cells/uL (ref 15–500)
Eosinophils Relative: 6.4 %
HCT: 39 % (ref 35.0–45.0)
Hemoglobin: 13.3 g/dL (ref 11.7–15.5)
Lymphs Abs: 1963 cells/uL (ref 850–3900)
MCH: 30.3 pg (ref 27.0–33.0)
MCHC: 34.1 g/dL (ref 32.0–36.0)
MCV: 88.8 fL (ref 80.0–100.0)
MPV: 10.6 fL (ref 7.5–12.5)
Monocytes Relative: 9 %
Neutro Abs: 3618 cells/uL (ref 1500–7800)
Neutrophils Relative %: 54 %
Platelets: 278 10*3/uL (ref 140–400)
RBC: 4.39 10*6/uL (ref 3.80–5.10)
RDW: 12.6 % (ref 11.0–15.0)
Total Lymphocyte: 29.3 %
WBC: 6.7 10*3/uL (ref 3.8–10.8)

## 2020-03-28 LAB — LIPID PANEL
Cholesterol: 153 mg/dL (ref <200)
HDL: 50 mg/dL (ref >=50)
LDL Cholesterol (Calc): 81 mg/dL (calc)
Non-HDL Cholesterol (Calc): 103 mg/dL (calc) (ref <130)
Total CHOL/HDL Ratio: 3.1 (calc) (ref <5.0)
Triglycerides: 120 mg/dL (ref <150)

## 2020-03-28 LAB — COMPLETE METABOLIC PANEL WITH GFR
AG Ratio: 1.8 (calc) (ref 1.0–2.5)
ALT: 16 U/L (ref 6–29)
AST: 19 U/L (ref 10–30)
Albumin: 4.7 g/dL (ref 3.6–5.1)
Alkaline phosphatase (APISO): 40 U/L (ref 31–125)
BUN: 15 mg/dL (ref 7–25)
CO2: 25 mmol/L (ref 20–32)
Calcium: 10 mg/dL (ref 8.6–10.2)
Chloride: 108 mmol/L (ref 98–110)
Creat: 0.78 mg/dL (ref 0.50–1.10)
GFR, Est African American: 113 mL/min/{1.73_m2} (ref >=60)
GFR, Est Non African American: 97 mL/min/{1.73_m2} (ref >=60)
Globulin: 2.6 g/dL (calc) (ref 1.9–3.7)
Glucose, Bld: 87 mg/dL (ref 65–99)
Potassium: 4.3 mmol/L (ref 3.5–5.3)
Sodium: 140 mmol/L (ref 135–146)
Total Bilirubin: 0.6 mg/dL (ref 0.2–1.2)
Total Protein: 7.3 g/dL (ref 6.1–8.1)

## 2020-03-28 LAB — HIV ANTIBODY (ROUTINE TESTING W REFLEX): HIV 1&2 Ab, 4th Generation: NONREACTIVE

## 2020-04-11 DIAGNOSIS — Z419 Encounter for procedure for purposes other than remedying health state, unspecified: Secondary | ICD-10-CM | POA: Diagnosis not present

## 2020-04-24 ENCOUNTER — Ambulatory Visit: Payer: Medicaid Other | Admitting: Family Medicine

## 2020-04-24 ENCOUNTER — Encounter: Payer: Self-pay | Admitting: Family Medicine

## 2020-04-24 ENCOUNTER — Other Ambulatory Visit: Payer: Self-pay

## 2020-04-24 VITALS — BP 116/72 | HR 96 | Temp 98.1°F | Resp 16 | Ht 67.0 in | Wt 140.3 lb

## 2020-04-24 DIAGNOSIS — Z87442 Personal history of urinary calculi: Secondary | ICD-10-CM

## 2020-04-24 DIAGNOSIS — M545 Low back pain, unspecified: Secondary | ICD-10-CM

## 2020-04-24 DIAGNOSIS — R350 Frequency of micturition: Secondary | ICD-10-CM | POA: Diagnosis not present

## 2020-04-24 DIAGNOSIS — R1012 Left upper quadrant pain: Secondary | ICD-10-CM

## 2020-04-24 DIAGNOSIS — R109 Unspecified abdominal pain: Secondary | ICD-10-CM

## 2020-04-24 DIAGNOSIS — B370 Candidal stomatitis: Secondary | ICD-10-CM

## 2020-04-24 LAB — POCT URINALYSIS DIPSTICK
Bilirubin, UA: NEGATIVE
Blood, UA: NEGATIVE
Glucose, UA: NEGATIVE
Ketones, UA: NEGATIVE
Leukocytes, UA: NEGATIVE
Nitrite, UA: NEGATIVE
Protein, UA: POSITIVE — AB
Spec Grav, UA: 1.01 (ref 1.010–1.025)
Urobilinogen, UA: 0.2 E.U./dL
pH, UA: 5 (ref 5.0–8.0)

## 2020-04-24 MED ORDER — TAMSULOSIN HCL 0.4 MG PO CAPS: 0.4000 mg | ORAL_CAPSULE | Freq: Every day | ORAL | 3 refills | Status: DC

## 2020-04-24 MED ORDER — KETOROLAC TROMETHAMINE 10 MG PO TABS: 10.0000 mg | ORAL_TABLET | Freq: Four times a day (QID) | ORAL | 0 refills | Status: DC | PRN

## 2020-04-24 MED ORDER — KETOROLAC TROMETHAMINE 30 MG/ML IJ SOLN: 30.0000 mg | Freq: Once | INTRAMUSCULAR | Status: DC

## 2020-04-24 MED ORDER — FLUCONAZOLE 100 MG PO TABS: ORAL_TABLET | ORAL | 0 refills | Status: DC

## 2020-04-24 MED ORDER — KETOROLAC TROMETHAMINE 60 MG/2ML IM SOLN
60.0000 mg | Freq: Once | INTRAMUSCULAR | Status: AC
  Administered 2020-04-24: 30 mg via INTRAMUSCULAR

## 2020-04-24 NOTE — Progress Notes (Signed)
Patient ID: Taylor Schwartz, female    DOB: [birth year], 39 y.o.   MRN: [id number]  PCP: Delsa Grana, PA-C  Chief Complaint  Patient presents with   Back Pain    Low back pain     Subjective:   Taylor Schwartz is a 39 y.o. female, presents to clinic with CC of the following:  Left flank pain and urinary frequency, onset a week ago, pain feels like a dull ache that has moved around left side to LLQ Worse was 8/10 severe enough to cause some nausea and wake her from sleep No hematuria Pain was similar to past kidney stone at that time, however then she had hematuria and currently no urine changes, no hematuria, only increased frequency  Flank Pain This is a recurrent problem. The current episode started in the past 7 days. The problem occurs intermittently. The problem is unchanged. Quality: dull and shooting. Radiates to: has radiated and migrated around left side - feels likes its moving to left side, left upper to mid abd. The pain is at a severity of 8/10 (waves of pain more severe, and dull constant pain at its worst 8/10 waking from sleep). Exacerbated by: No change in symptoms with positional changes, time a day, eating or with movement slightly more pain with palpation to left lower ribs, left side and left upper quadrant. Risk factors: Chronic back pain, history of kidney stone. Treatments tried: Tylenol. The treatment provided mild relief.  Urinary Frequency  This is a new problem. The current episode started in the past 7 days. The problem occurs every urination. The problem has been unchanged. Associated symptoms include flank pain, frequency and nausea. Pertinent negatives include no chills, discharge, hematuria, hesitancy, possible pregnancy, sweats, urgency or vomiting. She has tried acetaminophen for the symptoms. Her past medical history is significant for kidney stones. There is no history of catheterization, recurrent UTIs, a single kidney, urinary stasis or a  urological procedure.  Abdominal pain only nausea associated with severe pain with 1 single episode, denies otherwise any change in appetite, nausea, indigestion, vomiting, bowel changes, bloating.  No fever chills sweats rash, sick contacts  Results for orders placed or performed in visit on 04/24/20  POCT urinalysis dipstick  Result Value Ref Range   Color, UA yellow    Clarity, UA clear    Glucose, UA Negative Negative   Bilirubin, UA negative    Ketones, UA negative    Spec Grav, UA 1.010 1.010 - 1.025   Blood, UA negative    pH, UA 5.0 5.0 - 8.0   Protein, UA Positive (A) Negative   Urobilinogen, UA 0.2 0.2 or 1.0 E.U./dL   Nitrite, UA negative    Leukocytes, UA Negative Negative   Appearance clear    Odor none    Thrush in mouth since her son was born - years and years, she wonders if that could somehow be related to her new pain?     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 (Patient not taking: No sig reported), Disp: 18 g, Rfl: 1   Allergies  Allergen Reactions   Cyclobenzaprine  Itching   Mobic [Meloxicam] Anaphylaxis   Tramadol Hives   Amoxicillin Hives and Itching    Has patient had a PCN reaction causing immediate rash, facial/tongue/throat swelling, SOB or lightheadedness with hypotension: Yes Has patient had a PCN reaction causing severe rash involving mucus membranes or skin necrosis: No Has patient had a PCN reaction that required hospitalization: No Has patient had a PCN reaction occurring within the last 10 years: No If all of the above answers are "NO", then may proceed with Cephalosporin use.     Penicillins Hives, Itching and Rash    Has patient had a PCN reaction causing immediate rash, facial/tongue/throat swelling, SOB or lightheadedness with hypotension: Yes Has patient had a PCN reaction causing severe rash involving mucus membranes or skin necrosis: No Has patient had a PCN reaction that required hospitalization: No Has patient had a PCN reaction occurring within the last 10 years: No If all of the above answers are "NO", then may proceed with Cephalosporin use.      Social History   Tobacco Use   Smoking status: Current Every Day Smoker    Packs/day: 0.50    Types: Cigarettes   Smokeless tobacco: Never Used  Vaping Use   Vaping Use: Never used  Substance Use Topics   Alcohol use: No   Drug use: No      Chart Review Today: I personally reviewed active problem list, medication list, allergies, family history, social history, health maintenance, notes from last encounter, lab results, imaging with the patient/caregiver today.   Review of Systems  Constitutional: Negative for chills.  Gastrointestinal: Positive for nausea. Negative for vomiting.  Genitourinary: Positive for flank pain and frequency. Negative for hematuria, hesitancy and urgency.       Objective:   Vitals:   04/24/20 1524  BP: 116/72  Pulse: 96  Resp: 16  Temp: 98.1 F (36.7 C)  TempSrc: Oral  SpO2: 96%  Weight: 140 lb 4.8 oz (63.6 kg)   Height: 5\' 7"  (1.702 m)    Body mass index is 21.97 kg/m.  Physical Exam Vitals and nursing note reviewed.  Constitutional:      General: She is not in acute distress.    Appearance: Normal appearance. She is well-developed. She is not ill-appearing, toxic-appearing or diaphoretic.     Interventions: Face mask in place.  HENT:     Head: Normocephalic and atraumatic.     Right Ear: External ear normal.     Left Ear: External ear normal.  Eyes:     General: Lids are normal. No scleral icterus.       Right eye: No discharge.        Left eye: No discharge.     Conjunctiva/sclera: Conjunctivae normal.  Neck:     Trachea: Phonation normal. No tracheal deviation.  Cardiovascular:     Rate and Rhythm: Normal rate and regular rhythm.     Pulses: Normal pulses.  Radial pulses are 2+ on the right side and 2+ on the left side.       Posterior tibial pulses are 2+ on the right side and 2+ on the left side.     Heart sounds: Normal heart sounds. No murmur heard. No friction rub. No gallop.   Pulmonary:     Effort: Pulmonary effort is normal. No respiratory distress.     Breath sounds: Normal breath sounds. No stridor. No wheezing, rhonchi or rales.  Chest:     Chest wall: No tenderness.  Abdominal:     General: Abdomen is flat. Bowel sounds are normal. There is no distension.     Palpations: Abdomen is soft. There is no hepatomegaly, splenomegaly, mass or pulsatile mass.     Tenderness: There is abdominal tenderness in the left upper quadrant. There is no right CVA tenderness, left CVA tenderness, guarding or rebound.  Musculoskeletal:     Right lower leg: No edema.     Left lower leg: No edema.  Skin:    General: Skin is warm and dry.     Coloration: Skin is not jaundiced or pale.     Findings: No rash.  Neurological:     Mental Status: She is alert.     Motor: No abnormal muscle tone.     Gait: Gait normal.  Psychiatric:        Mood and Affect: Mood normal.         Speech: Speech normal.        Behavior: Behavior normal.         Results for orders placed or performed in visit on 04/24/20  POCT urinalysis dipstick  Result Value Ref Range   Color, UA yellow    Clarity, UA clear    Glucose, UA Negative Negative   Bilirubin, UA negative    Ketones, UA negative    Spec Grav, UA 1.010 1.010 - 1.025   Blood, UA negative    pH, UA 5.0 5.0 - 8.0   Protein, UA Positive (A) Negative   Urobilinogen, UA 0.2 0.2 or 1.0 E.U./dL   Nitrite, UA negative    Leukocytes, UA Negative Negative   Appearance clear    Odor none        Assessment & Plan:     ICD-10-CM   1. Left upper quadrant abdominal pain  R10.12 POCT urinalysis dipstick    DG Abd 1 View    CBC with Differential/Platelet    COMPLETE METABOLIC PANEL WITH GFR    Lipase    tamsulosin (FLOMAX) 0.4 MG CAPS capsule    ketorolac (TORADOL) 10 MG tablet    US Abdomen Complete   tender on exam without rebound or guarding -no CVA tenderness, normal bowel sounds, nondistended and soft  2. Urinary frequency  R35.0 POCT urinalysis dipstick    DG Abd 1 View    CBC with Differential/Platelet    COMPLETE METABOLIC PANEL WITH GFR    Urine Culture    US Abdomen Complete   urine dip unremarkable sent for culture  3. History of kidney stones  Z87.442 POCT urinalysis dipstick    DG Abd 1 View    CBC with Differential/Platelet    COMPLETE METABOLIC PANEL WITH GFR    Urine Culture    tamsulosin (FLOMAX) 0.4 MG CAPS capsule    ketorolac (TORADOL) 10 MG tablet    US Abdomen Complete    ketorolac (TORADOL) injection 60 mg    DISCONTINUED: ketorolac (TORADOL) 30  MG/ML injection 30 mg   History of her flank and abdominal pain today is suspicious for a kidney stone she has had in the past -Toradol and Flomax to treat KUB to screen  4. Left flank pain  R10.9 POCT urinalysis dipstick    DG Abd 1 View    CBC with Differential/Platelet    tamsulosin (FLOMAX) 0.4 MG CAPS capsule    ketorolac (TORADOL) 10 MG  tablet    US Abdomen Complete    ketorolac (TORADOL) injection 60 mg    DISCONTINUED: ketorolac (TORADOL) 30 MG/ML injection 30 mg   see #3, no CVA tenderness currently, pain migrating, unlikely to be pyelo  5. Oral thrush  B37.0 fluconazole (DIFLUCAN) 100 MG tablet   Patient reports longstanding oral thrush - tx with diflucan - unlikely to be related to new acute sx   DDx for flank/LUQ abd pain includes nephrolithiasis, pyelonephritis, pancreatitis, spleen?  Upper GI etiology? No vomiting or bowel changes Patient has no history of pancreatitis No splenomegaly palpated but she was very tender on the left lateral ribs when I try to percuss the spleen -my suspicion is that she may have a kidney stone that migrated to ureter -treating and covering for such discussed the medications and side effects, also discussed KUB is a screening that it may not show a kidney stone, and we could possibly evaluate her abdomen with with an ultrasound but it may need to go through a approval process first.  At this time she did not have any exam findings concerning enough to get a CT - vss, tolerating PO's, well-appearing, and nonacute abdomen, no guarding no rebound.       Delsa Grana, PA-C 04/24/20 3:39 PM

## 2020-04-25 LAB — COMPLETE METABOLIC PANEL WITH GFR
AG Ratio: 1.8 (calc) (ref 1.0–2.5)
ALT: 11 U/L (ref 6–29)
AST: 16 U/L (ref 10–30)
Albumin: 4.4 g/dL (ref 3.6–5.1)
Alkaline phosphatase (APISO): 41 U/L (ref 31–125)
BUN: 13 mg/dL (ref 7–25)
CO2: 26 mmol/L (ref 20–32)
Calcium: 9.6 mg/dL (ref 8.6–10.2)
Chloride: 107 mmol/L (ref 98–110)
Creat: 0.76 mg/dL (ref 0.50–1.10)
GFR, Est African American: 116 mL/min/{1.73_m2} (ref >=60)
GFR, Est Non African American: 100 mL/min/{1.73_m2} (ref >=60)
Globulin: 2.4 g/dL (calc) (ref 1.9–3.7)
Glucose, Bld: 80 mg/dL (ref 65–99)
Potassium: 4.1 mmol/L (ref 3.5–5.3)
Sodium: 140 mmol/L (ref 135–146)
Total Bilirubin: 0.5 mg/dL (ref 0.2–1.2)
Total Protein: 6.8 g/dL (ref 6.1–8.1)

## 2020-04-25 LAB — CBC WITH DIFFERENTIAL/PLATELET
Absolute Monocytes: 782 cells/uL (ref 200–950)
Basophils Absolute: 83 cells/uL (ref 0–200)
Basophils Relative: 0.9 %
Eosinophils Absolute: 184 cells/uL (ref 15–500)
Eosinophils Relative: 2 %
HCT: 39.5 % (ref 35.0–45.0)
Hemoglobin: 13.5 g/dL (ref 11.7–15.5)
Lymphs Abs: 1877 cells/uL (ref 850–3900)
MCH: 30.3 pg (ref 27.0–33.0)
MCHC: 34.2 g/dL (ref 32.0–36.0)
MCV: 88.8 fL (ref 80.0–100.0)
MPV: 10.4 fL (ref 7.5–12.5)
Monocytes Relative: 8.5 %
Neutro Abs: 6274 cells/uL (ref 1500–7800)
Neutrophils Relative %: 68.2 %
Platelets: 276 10*3/uL (ref 140–400)
RBC: 4.45 10*6/uL (ref 3.80–5.10)
RDW: 12.5 % (ref 11.0–15.0)
Total Lymphocyte: 20.4 %
WBC: 9.2 10*3/uL (ref 3.8–10.8)

## 2020-04-25 LAB — LIPASE: Lipase: 30 U/L (ref 7–60)

## 2020-04-25 LAB — URINE CULTURE
MICRO NUMBER:: 11317012
Result:: NO GROWTH
SPECIMEN QUALITY:: ADEQUATE

## 2020-05-01 ENCOUNTER — Ambulatory Visit
Admission: RE | Admit: 2020-05-01 | Discharge: 2020-05-01 | Disposition: A | Discharge status: Home / Self Care | Payer: Medicaid Other | Source: Ambulatory Visit | Attending: Family Medicine | Admitting: Family Medicine

## 2020-05-01 ENCOUNTER — Other Ambulatory Visit: Payer: Self-pay

## 2020-05-01 DIAGNOSIS — R1012 Left upper quadrant pain: Secondary | ICD-10-CM | POA: Insufficient documentation

## 2020-05-01 DIAGNOSIS — R350 Frequency of micturition: Secondary | ICD-10-CM | POA: Insufficient documentation

## 2020-05-01 DIAGNOSIS — Z87442 Personal history of urinary calculi: Secondary | ICD-10-CM | POA: Insufficient documentation

## 2020-05-01 DIAGNOSIS — R109 Unspecified abdominal pain: Secondary | ICD-10-CM | POA: Insufficient documentation

## 2020-05-12 DIAGNOSIS — Z419 Encounter for procedure for purposes other than remedying health state, unspecified: Secondary | ICD-10-CM | POA: Diagnosis not present

## 2020-05-15 ENCOUNTER — Ambulatory Visit: Payer: Medicaid Other | Admitting: Family Medicine

## 2020-06-12 DIAGNOSIS — Z419 Encounter for procedure for purposes other than remedying health state, unspecified: Secondary | ICD-10-CM | POA: Diagnosis not present

## 2020-07-10 DIAGNOSIS — Z419 Encounter for procedure for purposes other than remedying health state, unspecified: Secondary | ICD-10-CM | POA: Diagnosis not present

## 2020-08-10 DIAGNOSIS — Z419 Encounter for procedure for purposes other than remedying health state, unspecified: Secondary | ICD-10-CM | POA: Diagnosis not present

## 2020-08-16 ENCOUNTER — Ambulatory Visit: Payer: Medicaid Other | Admitting: Dermatology

## 2020-09-09 DIAGNOSIS — Z419 Encounter for procedure for purposes other than remedying health state, unspecified: Secondary | ICD-10-CM | POA: Diagnosis not present

## 2020-10-10 DIAGNOSIS — Z419 Encounter for procedure for purposes other than remedying health state, unspecified: Secondary | ICD-10-CM | POA: Diagnosis not present

## 2020-11-09 DIAGNOSIS — Z419 Encounter for procedure for purposes other than remedying health state, unspecified: Secondary | ICD-10-CM | POA: Diagnosis not present

## 2020-12-10 DIAGNOSIS — Z419 Encounter for procedure for purposes other than remedying health state, unspecified: Secondary | ICD-10-CM | POA: Diagnosis not present

## 2021-01-10 DIAGNOSIS — Z419 Encounter for procedure for purposes other than remedying health state, unspecified: Secondary | ICD-10-CM | POA: Diagnosis not present

## 2021-02-09 DIAGNOSIS — Z419 Encounter for procedure for purposes other than remedying health state, unspecified: Secondary | ICD-10-CM | POA: Diagnosis not present

## 2021-03-12 DIAGNOSIS — Z419 Encounter for procedure for purposes other than remedying health state, unspecified: Secondary | ICD-10-CM | POA: Diagnosis not present

## 2021-04-11 DIAGNOSIS — Z419 Encounter for procedure for purposes other than remedying health state, unspecified: Secondary | ICD-10-CM | POA: Diagnosis not present

## 2021-05-12 DIAGNOSIS — Z419 Encounter for procedure for purposes other than remedying health state, unspecified: Secondary | ICD-10-CM | POA: Diagnosis not present

## 2021-06-12 DIAGNOSIS — Z419 Encounter for procedure for purposes other than remedying health state, unspecified: Secondary | ICD-10-CM | POA: Diagnosis not present

## 2021-07-10 DIAGNOSIS — Z419 Encounter for procedure for purposes other than remedying health state, unspecified: Secondary | ICD-10-CM | POA: Diagnosis not present

## 2021-08-10 DIAGNOSIS — Z419 Encounter for procedure for purposes other than remedying health state, unspecified: Secondary | ICD-10-CM | POA: Diagnosis not present

## 2021-09-09 DIAGNOSIS — Z419 Encounter for procedure for purposes other than remedying health state, unspecified: Secondary | ICD-10-CM | POA: Diagnosis not present

## 2021-10-10 DIAGNOSIS — Z419 Encounter for procedure for purposes other than remedying health state, unspecified: Secondary | ICD-10-CM | POA: Diagnosis not present

## 2021-11-09 DIAGNOSIS — Z419 Encounter for procedure for purposes other than remedying health state, unspecified: Secondary | ICD-10-CM | POA: Diagnosis not present

## 2021-12-09 ENCOUNTER — Other Ambulatory Visit: Payer: Self-pay

## 2021-12-09 ENCOUNTER — Encounter: Payer: Self-pay | Admitting: Nurse Practitioner

## 2021-12-09 ENCOUNTER — Ambulatory Visit: Payer: Medicaid Other | Admitting: Nurse Practitioner

## 2021-12-09 VITALS — BP 122/84 | HR 85 | Temp 98.4°F | Resp 16 | Ht 67.0 in | Wt 139.3 lb

## 2021-12-09 DIAGNOSIS — Z1231 Encounter for screening mammogram for malignant neoplasm of breast: Secondary | ICD-10-CM

## 2021-12-09 DIAGNOSIS — M545 Low back pain, unspecified: Secondary | ICD-10-CM

## 2021-12-09 DIAGNOSIS — G8929 Other chronic pain: Secondary | ICD-10-CM | POA: Diagnosis not present

## 2021-12-09 MED ORDER — PREDNISONE 10 MG (21) PO TBPK: ORAL_TABLET | ORAL | 0 refills | Status: DC

## 2021-12-09 MED ORDER — TIZANIDINE HCL 4 MG PO TABS: 4.0000 mg | ORAL_TABLET | Freq: Three times a day (TID) | ORAL | 2 refills | Status: DC | PRN

## 2021-12-09 NOTE — Progress Notes (Signed)
BP 122/84   Pulse 85   Temp 98.4 F (36.9 C) (Oral)   Resp 16   Ht '5\' 7"'$  (1.702 m)   Wt 139 lb 4.8 oz (63.2 kg)   LMP 06/30/2017 (Approximate)   SpO2 99%   BMI 21.82 kg/m    Subjective:    Patient ID: Taylor Schwartz, female    DOB: 06-06-1980, 41 y.o.   MRN: [id number]  HPI: Taylor Schwartz is a 41 y.o. female  Chief Complaint  Patient presents with   Back Pain   Back Pain: She has an extensive back pain history.  She used to be seen at [facility].  Patient had been having lumbar pain extending into her right lower knee from her knee down since 2020.  She had had multiple rounds of prednisone, NSAIDs, muscle relaxants and physical therapy.  Was still in a lot of pain so [facility] did an MRI of her spine which I do not have access to.  According to [facility] notes patient had a L5-S1 disc herniation. she was also given an epidural steroid injection. She says they did do surgery on her back, lumbar discectomy, laminotomy, decompression of nerve roots, partial facetectomy, foraminotomy and disc removal.  Patient reports that she did not have a good experience with [facility].  Patient states she ended up having a CSF leak and had to have another surgery.  Patient does not want to go back to [facility].  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  No trauma.  She denies any urinary complaints, or incontinence.  She says that it is not so bad if she continues moving but when she sits or stands for a long time the pain increases.  She has been taking Bayer back and body for pain relief she says it does not really seem to be helping.  We will send in mild muscle relaxer and steroid pack.  Patient will continue taking Bayer back and body for pain relief.  Also placed a referral to orthopedics at [facility] clinic.   Relevant past medical, surgical, family and social history reviewed and updated as indicated. Interim medical  history since our last visit reviewed. Allergies and medications reviewed and updated.  Review of Systems  Constitutional: Negative for fever or weight change.  Respiratory: Negative for cough and shortness of breath.   Cardiovascular: Negative for chest pain or palpitations.  Gastrointestinal: Negative for abdominal pain, no bowel changes.  Musculoskeletal: Negative for gait problem or joint swelling.  Positive for lower back pain Skin: Negative for rash.  Neurological: Negative for dizziness or headache.  No other specific complaints in a complete review of systems (except as listed in HPI above).      Objective:    BP 122/84   Pulse 85   Temp 98.4 F (36.9 C) (Oral)   Resp 16   Ht '5\' 7"'$  (1.702 m)   Wt 139 lb 4.8 oz (63.2 kg)   LMP 06/30/2017 (Approximate)   SpO2 99%   BMI 21.82 kg/m   Wt Readings from Last 3 Encounters:  12/09/21 139 lb 4.8 oz (63.2 kg)  04/24/20 140 lb 4.8 oz (63.6 kg)  03/27/20 138 lb 6.4 oz (62.8 kg)    Physical Exam  Constitutional: Patient appears well-developed and well-nourished.  No distress.  HEENT: head atraumatic, normocephalic, pupils equal and reactive to light,  neck supple Cardiovascular: Normal rate, regular rhythm and normal heart  sounds.  No murmur heard. No BLE edema. Pulmonary/Chest: Effort normal and breath sounds normal. No respiratory distress. Abdominal: Soft.  There is no tenderness. MSK: Lumbar tenderness noted, no CVA tenderness Psychiatric: Patient has a normal mood and affect. behavior is normal. Judgment and thought content normal.  Results for orders placed or performed in visit on 04/24/20  Urine Culture   Specimen: Urine  Result Value Ref Range   MICRO NUMBER: [id number]    SPECIMEN QUALITY: Adequate    Sample Source URINE    STATUS: FINAL    Result: No Growth   CBC with Differential/Platelet  Result Value Ref Range   WBC 9.2 3.8 - 10.8 Thousand/uL   RBC 4.45 3.80 - 5.10 Million/uL   Hemoglobin 13.5 11.7 - 15.5  g/dL   HCT 39.5 35.0 - 45.0 %   MCV 88.8 80.0 - 100.0 fL   MCH 30.3 27.0 - 33.0 pg   MCHC 34.2 32.0 - 36.0 g/dL   RDW 12.5 11.0 - 15.0 %   Platelets 276 140 - 400 Thousand/uL   MPV 10.4 7.5 - 12.5 fL   Neutro Abs 6,274 1,500 - 7,800 cells/uL   Lymphs Abs 1,877 850 - 3,900 cells/uL   Absolute Monocytes 782 200 - 950 cells/uL   Eosinophils Absolute 184 15 - 500 cells/uL   Basophils Absolute 83 0 - 200 cells/uL   Neutrophils Relative % 68.2 %   Total Lymphocyte 20.4 %   Monocytes Relative 8.5 %   Eosinophils Relative 2.0 %   Basophils Relative 0.9 %  COMPLETE METABOLIC PANEL WITH GFR  Result Value Ref Range   Glucose, Bld 80 65 - 99 mg/dL   BUN 13 7 - 25 mg/dL   Creat 0.76 0.50 - 1.10 mg/dL   GFR, Est Non African American 100 > OR = 60 mL/min/1.38m   GFR, Est African American 116 > OR = 60 mL/min/1.734m  BUN/Creatinine Ratio NOT APPLICABLE 6 - 22 (calc)   Sodium 140 135 - 146 mmol/L   Potassium 4.1 3.5 - 5.3 mmol/L   Chloride 107 98 - 110 mmol/L   CO2 26 20 - 32 mmol/L   Calcium 9.6 8.6 - 10.2 mg/dL   Total Protein 6.8 6.1 - 8.1 g/dL   Albumin 4.4 3.6 - 5.1 g/dL   Globulin 2.4 1.9 - 3.7 g/dL (calc)   AG Ratio 1.8 1.0 - 2.5 (calc)   Total Bilirubin 0.5 0.2 - 1.2 mg/dL   Alkaline phosphatase (APISO) 41 31 - 125 U/L   AST 16 10 - 30 U/L   ALT 11 6 - 29 U/L  Lipase  Result Value Ref Range   Lipase 30 7 - 60 U/L  POCT urinalysis dipstick  Result Value Ref Range   Color, UA yellow    Clarity, UA clear    Glucose, UA Negative Negative   Bilirubin, UA negative    Ketones, UA negative    Spec Grav, UA 1.010 1.010 - 1.025   Blood, UA negative    pH, UA 5.0 5.0 - 8.0   Protein, UA Positive (A) Negative   Urobilinogen, UA 0.2 0.2 or 1.0 E.U./dL   Nitrite, UA negative    Leukocytes, UA Negative Negative   Appearance clear    Odor none       Assessment & Plan:   Problem List Items Addressed This Visit   None Visit Diagnoses     Chronic left-sided low back pain  without sciatica    -  Primary   Extensive back history.  We will treat with muscle relaxer and steroid pack.  Patient continue taking Bayer back and body.  Referral to orthopedics.   Relevant Medications   tiZANidine (ZANAFLEX) 4 MG tablet   predniSONE (STERAPRED UNI-PAK 21 TAB) 10 MG (21) TBPK tablet   Other Relevant Orders   Ambulatory referral to Orthopedic Surgery   Screening mammogram for breast cancer       Relevant Orders   MM Digital Screening        Follow up plan: Return if symptoms worsen or fail to improve, for follow up.

## 2021-12-10 DIAGNOSIS — Z419 Encounter for procedure for purposes other than remedying health state, unspecified: Secondary | ICD-10-CM | POA: Diagnosis not present

## 2022-01-10 DIAGNOSIS — Z419 Encounter for procedure for purposes other than remedying health state, unspecified: Secondary | ICD-10-CM | POA: Diagnosis not present

## 2022-02-09 DIAGNOSIS — Z419 Encounter for procedure for purposes other than remedying health state, unspecified: Secondary | ICD-10-CM | POA: Diagnosis not present

## 2022-02-13 ENCOUNTER — Ambulatory Visit
Admission: RE | Admit: 2022-02-13 | Discharge: 2022-02-13 | Disposition: A | Discharge status: Home / Self Care | Payer: Medicaid Other | Source: Ambulatory Visit | Attending: Nurse Practitioner | Admitting: Nurse Practitioner

## 2022-02-13 DIAGNOSIS — Z1231 Encounter for screening mammogram for malignant neoplasm of breast: Secondary | ICD-10-CM | POA: Insufficient documentation

## 2022-02-19 ENCOUNTER — Other Ambulatory Visit: Payer: Self-pay | Admitting: Nurse Practitioner

## 2022-02-19 ENCOUNTER — Inpatient Hospital Stay
Admission: RE | Admit: 2022-02-19 | Discharge: 2022-02-19 | Disposition: A | Discharge status: Home / Self Care | Payer: Self-pay | Source: Ambulatory Visit | Attending: *Deleted | Admitting: *Deleted

## 2022-02-19 ENCOUNTER — Other Ambulatory Visit: Payer: Self-pay | Admitting: *Deleted

## 2022-02-19 DIAGNOSIS — Z1231 Encounter for screening mammogram for malignant neoplasm of breast: Secondary | ICD-10-CM

## 2022-02-19 DIAGNOSIS — N6489 Other specified disorders of breast: Secondary | ICD-10-CM

## 2022-02-19 DIAGNOSIS — R928 Other abnormal and inconclusive findings on diagnostic imaging of breast: Secondary | ICD-10-CM

## 2022-03-11 ENCOUNTER — Ambulatory Visit
Admission: RE | Admit: 2022-03-11 | Discharge: 2022-03-11 | Disposition: A | Discharge status: Home / Self Care | Payer: Medicaid Other | Source: Ambulatory Visit | Attending: Nurse Practitioner | Admitting: Nurse Practitioner

## 2022-03-11 DIAGNOSIS — R928 Other abnormal and inconclusive findings on diagnostic imaging of breast: Secondary | ICD-10-CM

## 2022-03-11 DIAGNOSIS — N6489 Other specified disorders of breast: Secondary | ICD-10-CM | POA: Insufficient documentation

## 2022-03-12 ENCOUNTER — Other Ambulatory Visit: Payer: Self-pay | Admitting: Nurse Practitioner

## 2022-03-12 DIAGNOSIS — Z419 Encounter for procedure for purposes other than remedying health state, unspecified: Secondary | ICD-10-CM | POA: Diagnosis not present

## 2022-03-12 DIAGNOSIS — N63 Unspecified lump in unspecified breast: Secondary | ICD-10-CM

## 2022-03-12 DIAGNOSIS — R928 Other abnormal and inconclusive findings on diagnostic imaging of breast: Secondary | ICD-10-CM

## 2022-03-21 ENCOUNTER — Ambulatory Visit
Admission: RE | Admit: 2022-03-21 | Discharge: 2022-03-21 | Disposition: A | Discharge status: Home / Self Care | Payer: Medicaid Other | Source: Ambulatory Visit | Attending: Nurse Practitioner | Admitting: Nurse Practitioner

## 2022-03-21 DIAGNOSIS — R928 Other abnormal and inconclusive findings on diagnostic imaging of breast: Secondary | ICD-10-CM | POA: Insufficient documentation

## 2022-03-21 DIAGNOSIS — D242 Benign neoplasm of left breast: Secondary | ICD-10-CM | POA: Diagnosis not present

## 2022-03-21 DIAGNOSIS — N63 Unspecified lump in unspecified breast: Secondary | ICD-10-CM

## 2022-03-21 DIAGNOSIS — N6321 Unspecified lump in the left breast, upper outer quadrant: Secondary | ICD-10-CM | POA: Diagnosis not present

## 2022-03-21 HISTORY — PX: BREAST BIOPSY: SHX20

## 2022-03-24 LAB — SURGICAL PATHOLOGY

## 2022-04-11 DIAGNOSIS — Z419 Encounter for procedure for purposes other than remedying health state, unspecified: Secondary | ICD-10-CM | POA: Diagnosis not present

## 2022-05-12 DIAGNOSIS — Z419 Encounter for procedure for purposes other than remedying health state, unspecified: Secondary | ICD-10-CM | POA: Diagnosis not present

## 2022-06-12 DIAGNOSIS — Z419 Encounter for procedure for purposes other than remedying health state, unspecified: Secondary | ICD-10-CM | POA: Diagnosis not present

## 2022-07-11 DIAGNOSIS — Z419 Encounter for procedure for purposes other than remedying health state, unspecified: Secondary | ICD-10-CM | POA: Diagnosis not present

## 2022-08-05 ENCOUNTER — Ambulatory Visit (INDEPENDENT_AMBULATORY_CARE_PROVIDER_SITE_OTHER): Payer: Medicaid Other | Admitting: Physician Assistant

## 2022-08-05 ENCOUNTER — Ambulatory Visit: Payer: Self-pay

## 2022-08-05 ENCOUNTER — Encounter: Payer: Self-pay | Admitting: Physician Assistant

## 2022-08-05 ENCOUNTER — Ambulatory Visit
Admission: RE | Admit: 2022-08-05 | Discharge: 2022-08-05 | Disposition: A | Discharge status: Home / Self Care | Payer: Medicaid Other | Source: Ambulatory Visit | Attending: Physician Assistant | Admitting: Physician Assistant

## 2022-08-05 ENCOUNTER — Telehealth: Payer: Self-pay | Admitting: Family Medicine

## 2022-08-05 ENCOUNTER — Ambulatory Visit
Admission: RE | Admit: 2022-08-05 | Discharge: 2022-08-05 | Disposition: A | Discharge status: Home / Self Care | Payer: Medicaid Other | Attending: Physician Assistant | Admitting: Physician Assistant

## 2022-08-05 ENCOUNTER — Other Ambulatory Visit: Payer: Self-pay

## 2022-08-05 ENCOUNTER — Encounter: Payer: Self-pay | Admitting: Nurse Practitioner

## 2022-08-05 VITALS — BP 118/82 | HR 98 | Temp 98.0°F | Resp 18 | Ht 67.0 in | Wt 151.6 lb

## 2022-08-05 DIAGNOSIS — R072 Precordial pain: Secondary | ICD-10-CM | POA: Diagnosis not present

## 2022-08-05 DIAGNOSIS — R0789 Other chest pain: Secondary | ICD-10-CM

## 2022-08-05 DIAGNOSIS — R0781 Pleurodynia: Secondary | ICD-10-CM | POA: Diagnosis not present

## 2022-08-05 NOTE — Telephone Encounter (Signed)
  Chief Complaint: Rib pain Symptoms: above Frequency:  Pertinent Negatives: Patient denies  Disposition: [] ED /[] Urgent Care (no appt availability in office) / [] Appointment(In office/virtual)/ []  [facility]/ [] Home Care/ [] Refused Recommended Disposition /[] [place]/ [x]  Follow-up with PCP Additional Notes: PT was seen today at the office for rib pain. PT was disappointed by her visit in that she was not given any pain medication for her pain.  Pt also states that provider pushed on her ribs more than needed, causing her more pain. Pt would like to be notified as soon as provider reviews x-ray for results. And would like pain medication prescribed.   Please advise.   Reason for Disposition  Prescription request for new medicine (not a refill)  Answer Assessment - Initial Assessment Questions 1. DRUG NAME: "What medicine do you need to have refilled?"     Pain medication 2. REFILLS REMAINING: "How many refills are remaining?" (Note: The label on the medicine or pill bottle will show how many refills are remaining. If there are no refills remaining, then a renewal may be needed.)     none 3. EXPIRATION DATE: "What is the expiration date?" (Note: The label states when the prescription will expire, and thus can no longer be refilled.)      4. PRESCRIBING HCP: "Who prescribed it?" Reason: If prescribed by specialist, call should be referred to that group.      5. SYMPTOMS: "Do you have any symptoms?"     Pain  Protocols used: Medication Refill and Renewal Call-A-AH

## 2022-08-05 NOTE — Telephone Encounter (Signed)
Copied from [place] [id number]. Topic: General - Call Back - No Documentation >> Aug 05, 2022 10:13 AM Cyndi Bender wrote: Reason for CRM: Pt stated she just had a missed call so she was returning the call

## 2022-08-05 NOTE — Patient Instructions (Signed)
Rib pain can be difficult to manage  You can continue to use Tylenol and Ibuprofen to assist with the pain   You can use ice or heat as preferred over the area to help with the pain  Please avoid lifting heavy objects- no more than 10 lbs at this time   It is important to take deep breaths every few minutes to avoid damage to your lungs- try to take several deep breaths every 5-10 minutes while you are awake to prevent decreases in lung function  It was nice to meet you and I appreciate the opportunity to be involved in your care If you were satisfied with the care you received from me, I would greatly appreciate you saying so in the after-visit survey that is sent out following our visit.

## 2022-08-05 NOTE — Progress Notes (Signed)
Acute Office Visit   Patient: Taylor Schwartz   DOB: [birth year]   42 y.o. Female  MRN: [id number] Visit Date: 08/05/2022  Today's healthcare provider: Dani Gobble Nirvaan Frett, PA-C  Introduced myself to the patient as a Journalist, newspaper and provided education on APPs in clinical practice.    Chief Complaint  Patient presents with   Rib Injury    For 4 days on left side, hurts to breath in pain radiates to back. Was working on vinyl roofing   Referral    For 6 month follow up mammogram    Subjective    HPI HPI     Rib Injury    Additional comments: For 4 days on left side, hurts to breath in pain radiates to back. Was working on vinyl roofing        Referral    Additional comments: For 6 month follow up mammogram       Last edited by Hollie Salk, [place] on 08/05/2022  2:37 PM.       Rib pain and tenderness  She threw something away in a dumpster and tried to hop over the side to get it  She she landed on the side of the dumpster she heard a popping noise and started to have pain in the left side of her ribs   Onset: sudden  Duration: since Sat  Location: under left breast  Radiation: reports she feels like the pain shoots straight through to her back   Pain level and character: throbbing 2-3/10 while sitting still. When aggravated it can get up to a 10/10   Interventions: Tylenol and Advil, ice, Tylenol arthritis  Recent trauma or injury: yes - see above   Alleviating: nothing really helps  Aggravating: lifting left arm above head hurts and pulls along the area. Laying flat on her back. Pushing or pulling things with arms    She reports when she wears tighter thermal clothing, the constriction and pressure seems to make the pain better     Medications: Outpatient Medications Prior to Visit  Medication Sig   levocetirizine (XYZAL) 5 MG tablet Take 1 tablet (5 mg total) by mouth every evening.   tamsulosin (FLOMAX) 0.4 MG CAPS capsule Take 1 capsule (0.4 mg total)  by mouth daily.   albuterol (VENTOLIN HFA) 108 (90 Base) MCG/ACT inhaler Inhale 2 puffs into the lungs every 4 (four) hours as needed for wheezing or shortness of breath. (Patient not taking: Reported on 03/27/2020)   fluconazole (DIFLUCAN) 100 MG tablet Take 200 mg (2 tablets) po daily x 1d, then take 100 mg (1 tab) po daily x 13 d for oral thrush (Patient not taking: Reported on 12/09/2021)   ketorolac (TORADOL) 10 MG tablet Take 1 tablet (10 mg total) by mouth every 6 (six) hours as needed. (Patient not taking: Reported on 12/09/2021)   predniSONE (STERAPRED UNI-PAK 21 TAB) 10 MG (21) TBPK tablet Take as directed on package.  (60 mg po on day 1, 50 mg po on day 2...) (Patient not taking: Reported on 08/05/2022)   tiZANidine (ZANAFLEX) 4 MG tablet Take 1 tablet (4 mg total) by mouth every 8 (eight) hours as needed for muscle spasms (muscle tightness). (Patient not taking: Reported on 08/05/2022)   No facility-administered medications prior to visit.    Review of Systems  Respiratory:  Negative for chest tightness.        Pain with deep inspiration   Musculoskeletal:  Positive  for myalgias.       Rib pain along left side        Objective    BP 118/82   Pulse 98   Temp 98 F (36.7 C) (Oral)   Resp 18   Ht 5\' 7"  (1.702 m)   Wt 151 lb 9.6 oz (68.8 kg)   LMP 06/30/2017 (Approximate)   SpO2 98%   BMI 23.74 kg/m    Physical Exam Vitals reviewed.  Constitutional:      General: She is awake.     Appearance: Normal appearance. She is well-developed and well-groomed.  HENT:     Head: Normocephalic and atraumatic.  Cardiovascular:     Rate and Rhythm: Normal rate and regular rhythm.     Heart sounds: Normal heart sounds. No murmur heard.    No friction rub. No gallop.  Pulmonary:     Effort: Pulmonary effort is normal.     Breath sounds: No decreased air movement. No decreased breath sounds, wheezing, rhonchi or rales.  Chest:     Chest wall: Swelling and tenderness present. No  mass, lacerations or deformity.       Comments: Mild swelling over inferior aspect of sternum and some swelling over left lower ribs   Musculoskeletal:     Cervical back: Normal range of motion.  Neurological:     Mental Status: She is alert.  Psychiatric:        Attention and Perception: Attention and perception normal.        Mood and Affect: Mood and affect normal.        Speech: Speech normal.        Behavior: Behavior is cooperative.       No results found for any visits on 08/05/22.  Assessment & Plan      No follow-ups on file.       Problem List Items Addressed This Visit   None Visit Diagnoses     Rib pain on left side    -  Primary Acute, new concern Patient is tender along right lower ribs and sternum - there is mild swelling to the area as well Reviewed rib and sternum imaging results- both were negative for fracture or dislocation Suspect cartilaginous injury or thoracic muscle strain at this time  Reviewed taking deep breathes throughout the day to prevent risk of atelectasis Recommend refraining from lifting heavy objects to avoid further discomfort Recommend alternating Tylenol and Ibuprofen or NSAID of choice to assist with pain  Reviewed allergies and she is allergic to flexeril so I am concerned for cross-reactivity to other muscle relaxants Follow up as needed for persistent or progressing symptoms     Relevant Orders   DG Ribs Unilateral Left (Completed)   DG Sternum (Completed)   Sternal pain       Relevant Orders   DG Sternum (Completed)        No follow-ups on file.   I, Ameliyah Sarno E Kaylan Friedmann, PA-C, have reviewed all documentation for this visit. The documentation on 08/08/22 for the exam, diagnosis, procedures, and orders are all accurate and complete.   Talitha Givens, MHS, PA-C [facility]

## 2022-08-05 NOTE — Telephone Encounter (Signed)
I have not called this patient

## 2022-08-06 ENCOUNTER — Encounter: Payer: Self-pay | Admitting: Physician Assistant

## 2022-08-08 ENCOUNTER — Other Ambulatory Visit: Payer: Self-pay | Admitting: Nurse Practitioner

## 2022-08-08 DIAGNOSIS — N6489 Other specified disorders of breast: Secondary | ICD-10-CM

## 2022-08-08 NOTE — Progress Notes (Signed)
Your imaging did not show evidence of any fractures or dislocations along the ribs or sternum I suspect that you have likely strained the muscles along your ribs or potentially injured the cartilage that holds the lower ribs in place. I recommend rest as you are able and please make sure you are taking deep breaths to prevent lung issues.  You can continue to alternate Tylenol and Advil for pain management You can have up to 3500 mg of Tylenol per 24 hours and up to 3000 mg of Ibuprofen/ Advil per 24 hours. If you use the NSAIDs consistently please make sure you drink plenty of water and take with food to avoid GI upset.

## 2022-08-11 DIAGNOSIS — Z419 Encounter for procedure for purposes other than remedying health state, unspecified: Secondary | ICD-10-CM | POA: Diagnosis not present

## 2022-09-02 ENCOUNTER — Ambulatory Visit: Payer: Medicaid Other | Admitting: Family Medicine

## 2022-09-10 DIAGNOSIS — Z419 Encounter for procedure for purposes other than remedying health state, unspecified: Secondary | ICD-10-CM | POA: Diagnosis not present

## 2022-09-13 DIAGNOSIS — S92354A Nondisplaced fracture of fifth metatarsal bone, right foot, initial encounter for closed fracture: Secondary | ICD-10-CM | POA: Diagnosis not present

## 2022-09-22 ENCOUNTER — Ambulatory Visit
Admission: RE | Admit: 2022-09-22 | Discharge: 2022-09-22 | Disposition: A | Discharge status: Home / Self Care | Payer: Medicaid Other | Source: Ambulatory Visit | Attending: Nurse Practitioner | Admitting: Nurse Practitioner

## 2022-09-22 ENCOUNTER — Other Ambulatory Visit: Payer: Self-pay | Admitting: Family Medicine

## 2022-09-22 DIAGNOSIS — N6489 Other specified disorders of breast: Secondary | ICD-10-CM | POA: Diagnosis not present

## 2022-09-22 DIAGNOSIS — N6012 Diffuse cystic mastopathy of left breast: Secondary | ICD-10-CM

## 2022-09-22 DIAGNOSIS — R92333 Mammographic heterogeneous density, bilateral breasts: Secondary | ICD-10-CM | POA: Diagnosis not present

## 2022-09-22 DIAGNOSIS — N6019 Diffuse cystic mastopathy of unspecified breast: Secondary | ICD-10-CM

## 2022-09-24 DIAGNOSIS — S92354A Nondisplaced fracture of fifth metatarsal bone, right foot, initial encounter for closed fracture: Secondary | ICD-10-CM | POA: Diagnosis not present

## 2022-10-11 DIAGNOSIS — Z419 Encounter for procedure for purposes other than remedying health state, unspecified: Secondary | ICD-10-CM | POA: Diagnosis not present

## 2022-11-10 DIAGNOSIS — Z419 Encounter for procedure for purposes other than remedying health state, unspecified: Secondary | ICD-10-CM | POA: Diagnosis not present

## 2022-12-05 ENCOUNTER — Ambulatory Visit: Payer: Medicaid Other | Admitting: Nurse Practitioner

## 2022-12-11 DIAGNOSIS — Z419 Encounter for procedure for purposes other than remedying health state, unspecified: Secondary | ICD-10-CM | POA: Diagnosis not present

## 2022-12-22 NOTE — Patient Instructions (Incomplete)

## 2022-12-23 ENCOUNTER — Encounter: Payer: Self-pay | Admitting: Family Medicine

## 2022-12-23 ENCOUNTER — Ambulatory Visit (INDEPENDENT_AMBULATORY_CARE_PROVIDER_SITE_OTHER): Payer: Medicaid Other | Admitting: Family Medicine

## 2022-12-23 VITALS — BP 110/80 | HR 87 | Temp 98.4°F | Resp 16 | Ht 65.5 in | Wt 146.2 lb

## 2022-12-23 DIAGNOSIS — Z85820 Personal history of malignant melanoma of skin: Secondary | ICD-10-CM | POA: Diagnosis not present

## 2022-12-23 DIAGNOSIS — Z23 Encounter for immunization: Secondary | ICD-10-CM | POA: Diagnosis not present

## 2022-12-23 DIAGNOSIS — D229 Melanocytic nevi, unspecified: Secondary | ICD-10-CM

## 2022-12-23 DIAGNOSIS — Z0000 Encounter for general adult medical examination without abnormal findings: Secondary | ICD-10-CM

## 2022-12-23 DIAGNOSIS — N6012 Diffuse cystic mastopathy of left breast: Secondary | ICD-10-CM

## 2022-12-23 DIAGNOSIS — Z720 Tobacco use: Secondary | ICD-10-CM

## 2022-12-23 DIAGNOSIS — Z90710 Acquired absence of both cervix and uterus: Secondary | ICD-10-CM | POA: Diagnosis not present

## 2022-12-23 LAB — CBC WITH DIFFERENTIAL/PLATELET
Absolute Monocytes: 855 cells/uL (ref 200–950)
Basophils Absolute: 47 cells/uL (ref 0–200)
Basophils Relative: 0.5 %
Eosinophils Absolute: 150 cells/uL (ref 15–500)
Eosinophils Relative: 1.6 %
HCT: 40.4 % (ref 35.0–45.0)
Hemoglobin: 13.6 g/dL (ref 11.7–15.5)
Lymphs Abs: 2049 cells/uL (ref 850–3900)
MCH: 30.3 pg (ref 27.0–33.0)
MCHC: 33.7 g/dL (ref 32.0–36.0)
MCV: 90 fL (ref 80.0–100.0)
MPV: 10.5 fL (ref 7.5–12.5)
Monocytes Relative: 9.1 %
Neutro Abs: 6298 cells/uL (ref 1500–7800)
Neutrophils Relative %: 67 %
Platelets: 282 10*3/uL (ref 140–400)
RBC: 4.49 10*6/uL (ref 3.80–5.10)
RDW: 12.5 % (ref 11.0–15.0)
Total Lymphocyte: 21.8 %
WBC: 9.4 10*3/uL (ref 3.8–10.8)

## 2022-12-23 NOTE — Progress Notes (Signed)
Patient: Taylor Schwartz, Female    DOB: [birth year], 42 y.o.   MRN: [id number] Danelle Berry, PA-C Visit Date: 12/23/2022  Today's Provider: Danelle Berry, PA-C   Chief Complaint  Patient presents with   Annual Exam   Subjective:   Annual physical exam:  Taylor Schwartz is a 42 y.o. female who presents today for complete physical exam:  Exercise/Activity:  active most days of the week   SDOH Screenings   Food Insecurity: No Food Insecurity (12/23/2022)  Housing: Low Risk  (12/23/2022)  Transportation Needs: No Transportation Needs (12/23/2022)  Utilities: Not At Risk (12/23/2022)  Alcohol Screen: Low Risk  (08/05/2022)  Depression (PHQ2-9): Low Risk  (12/23/2022)  Financial Resource Strain: Low Risk  (12/23/2022)  Physical Activity: Sufficiently Active (12/23/2022)  Social Connections: Socially Isolated (12/23/2022)  Stress: No Stress Concern Present (12/23/2022)  Tobacco Use: High Risk (12/23/2022)  Health Literacy: Adequate Health Literacy (12/23/2022)    USPSTF grade A and B recommendations - reviewed and addressed today  Depression:  Phq 9 completed today by patient, was reviewed by me with patient in the room PHQ score is neg, pt feels good    12/23/2022    3:19 PM 08/05/2022    2:36 PM 12/09/2021    1:42 PM 04/24/2020    3:27 PM  PHQ 2/9 Scores  PHQ - 2 Score 0 0 0 0  PHQ- 9 Score 0         12/23/2022    3:19 PM 08/05/2022    2:36 PM 12/09/2021    1:42 PM 04/24/2020    3:27 PM 03/27/2020   11:19 AM  Depression screen PHQ 2/9  Decreased Interest 0 0 0 0 2  Down, Depressed, Hopeless 0 0 0 0 2  PHQ - 2 Score 0 0 0 0 4  Altered sleeping 0    3  Tired, decreased energy 0    0  Change in appetite 0    0  Feeling bad or failure about yourself  0    0  Trouble concentrating 0    2  Moving slowly or fidgety/restless 0    0  Suicidal thoughts 0    0  PHQ-9 Score 0    9  Difficult doing work/chores Not difficult at all    Somewhat difficult    Alcohol  screening: Flowsheet Row Office Visit from 08/05/2022 in [facility]  AUDIT-C Score 0       Immunizations and Health Maintenance: Health Maintenance  Topic Date Due   DTaP/Tdap/Td (2 - Td or Tdap) 02/10/2020   INFLUENZA VACCINE  12/11/2022   COVID-19 Vaccine (1) 01/08/2023 (Originally 03/05/1986)   MAMMOGRAM  09/22/2023   Hepatitis C Screening  Completed   HIV Screening  Completed   HPV VACCINES  Aged Out   PAP SMEAR-Modifier  Discontinued     Hep C Screening: done previously  STD testing and prevention (HIV/chl/gon/syphilis):  see above, no additional testing desired by pt today  Intimate partner violence:  no concerns - safe  Sexual History/Pain during Intercourse: Significant Other - one partner for 24 years   Menstrual History/LMP/Abnormal Bleeding: none Patient's last menstrual period was 06/30/2017 (approximate).  Incontinence Symptoms: none   Breast cancer: scheduled Last Mammogram: *see HM list above  Cervical cancer screening: d/c due to hysterectomy  Osteoporosis:  unilateral oophrectomy, smoking hx Discussion on osteoporosis per age, including high calcium and vitamin D supplementation, weight bearing exercises  Skin cancer:  Hx of skin CA -  possibly some + past biopsy to abd she says was positive for melanoma  Discussed atypical lesions   Colorectal cancer:   Colonoscopy is not due  Discussed concerning signs and sx of CRC, pt denies change in bowels, blood in stool  Lung cancer:   Low Dose CT Chest recommended if Age 73-80 years, 20 pack-year currently smoking OR have quit w/in 15years. Patient does not qualify.    Social History   Tobacco Use   Smoking status: Every Day    Current packs/day: 0.50    Types: Cigarettes   Smokeless tobacco: Never  Vaping Use   Vaping status: Never Used  Substance Use Topics   Alcohol use: No   Drug use: No     Flowsheet Row Office Visit from 08/05/2022 in [facility]  AUDIT-C Score 0       Family History  Problem Relation Age of Onset   Lupus Mother    Arthritis Mother    Glaucoma Mother    Vision loss Mother    Diabetes Mother    Ovarian cancer Mother 63   Diabetes Father    Prostate cancer Father 53   Diabetes Maternal Grandmother    Dementia Maternal Grandmother    Diabetes Maternal Grandfather    Pneumonia Maternal Grandfather    Diabetes Paternal Grandmother    Diabetes Paternal Grandfather    Autism Son      Blood pressure/Hypertension: BP Readings from Last 3 Encounters:  12/23/22 110/80  08/05/22 118/82  12/09/21 122/84    Weight/Obesity: Wt Readings from Last 3 Encounters:  12/23/22 146 lb 3.2 oz (66.3 kg)  08/05/22 151 lb 9.6 oz (68.8 kg)  12/09/21 139 lb 4.8 oz (63.2 kg)   BMI Readings from Last 3 Encounters:  12/23/22 23.96 kg/m  08/05/22 23.74 kg/m  12/09/21 21.82 kg/m     Lipids:  Lab Results  Component Value Date   CHOL 153 03/27/2020   CHOL 162 05/07/2018   Lab Results  Component Value Date   HDL 50 03/27/2020   HDL 54 05/07/2018   Lab Results  Component Value Date   LDLCALC 81 03/27/2020   LDLCALC 88 05/07/2018   Lab Results  Component Value Date   TRIG 120 03/27/2020   TRIG 100 05/07/2018   Lab Results  Component Value Date   CHOLHDL 3.1 03/27/2020   CHOLHDL 3.0 05/07/2018   No results found for: "LDLDIRECT" Based on the results of lipid panel his/her cardiovascular risk factor ( using [place] Cohort )  in the next 10 years is: The ASCVD Risk score (Arnett DK, et al., 2019) failed to calculate for the following reasons:   The 2019 ASCVD risk score is only valid for ages 54 to 74  Glucose:  Glucose, Bld  Date Value Ref Range Status  04/24/2020 80 65 - 99 mg/dL Final    Comment:    .            Fasting reference interval .   03/27/2020 87 65 - 99 mg/dL Final    Comment:    .            Fasting reference interval .   05/07/2018 76 65 - 99 mg/dL Final     Comment:    .            Fasting reference interval .     Advanced Care Planning:  A voluntary discussion about advance care  planning including the explanation and discussion of advance directives.   Discussed health care proxy and Living will, and the patient was able to identify a health care proxy as mom Laruth Bouchard.   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   Occupation: full time Consulting civil engineer  Tobacco Use   Smoking status: Every Day    Current packs/day: 0.50    Types: Cigarettes   Smokeless tobacco: Never  Vaping Use   Vaping status: Never Used  Substance and Sexual Activity   Alcohol use: No   Drug use: No   Sexual activity: Yes    Partners: Male    Birth control/protection: None  Other Topics Concern   Not on file  Social History Narrative   Walks 5-6 miles everyday   Social Determinants of Health   Financial Resource Strain: Low Risk  (12/23/2022)   Overall Financial Resource Strain (CARDIA)    Difficulty of Paying Living Expenses: Not hard at all  Food Insecurity: No Food Insecurity (12/23/2022)   Hunger Vital Sign    Worried About Running Out of Food in the Last Year: Never true    Ran Out of Food in the Last Year: Never true  Transportation Needs: No Transportation Needs (12/23/2022)   PRAPARE - Administrator, Civil Service (Medical): No    Lack of Transportation (Non-Medical): No  Physical Activity: Sufficiently Active (12/23/2022)   Exercise Vital Sign    Days of Exercise per Week: 5 days    Minutes of Exercise per Session: 60 min  Stress: No Stress Concern Present (12/23/2022)   [employer] of Occupational Health - Occupational Stress Questionnaire    Feeling of Stress  : Only a little  Social Connections: Socially Isolated (12/23/2022)   Social Connection and Isolation Panel [NHANES]    Frequency of Communication with Friends and Family: More than three times a week    Frequency of Social Gatherings with Friends and Family: More than three times a week    Attends Religious Services: Never    Database administrator or Organizations: No    Attends Engineer, structural: Never    Marital Status: Never married    Family History        Family History  Problem Relation Age of Onset   Lupus Mother    Arthritis Mother    Glaucoma Mother    Vision loss Mother    Diabetes Mother    Ovarian cancer Mother 66   Diabetes Father    Prostate cancer Father 68   Diabetes Maternal Grandmother    Dementia Maternal Grandmother    Diabetes Maternal Grandfather    Pneumonia Maternal Grandfather    Diabetes Paternal Grandmother    Diabetes Paternal Grandfather    Autism Son     Patient Active Problem List   Diagnosis Date Noted   Lumbar radiculopathy 10/27/2018   Anxiety 05/13/2018   Poor sleep 05/13/2018   Hx of malignant melanoma 05/07/2018   S/P laparoscopic hysterectomy 07/16/2017   Endometriosis determined by laparoscopy 10/16/2016   Diarrhea 07/04/2016   Abdominal pain 07/04/2016   Complex cyst of left ovary 07/04/2016   Tobacco abuse 07/04/2016  Fibrocystic changes of left breast 01/28/2016   Cervical radiculopathy 07/06/2015   Compound nevus 07/06/2015   Esophageal reflux 07/06/2015   Recurrent major depression (HCC) 07/06/2015   Depression with anxiety 10/17/2014    Past Surgical History:  Procedure Laterality Date   BACK SURGERY  2018   [facility]   BREAST BIOPSY Left 03/21/2022   [place] Bx, Ribbon Clip, Probable fibroadenoma   BREAST BIOPSY Left 03/21/2022   [place] LT BREAST BX W LOC DEV 1ST LESION IMG BX SPEC US GUIDE 03/21/2022 [facility]   BREAST SURGERY Left 05/2016   benign. metal clip to identify area when mammograms    CHOLECYSTECTOMY  2008   CYSTOSCOPY N/A 07/16/2017   Procedure: CYSTOSCOPY;  Surgeon: Vena Austria, MD;  Location: [facility];  Service: Gynecology;  Laterality: N/A;   LAPAROSCOPIC HYSTERECTOMY Bilateral 07/16/2017   Procedure: HYSTERECTOMY TOTAL LAPAROSCOPIC,LEFT SALPINGO OPHORECTOMY,RIGHT SALPINGECTOMY;  Surgeon: Vena Austria, MD;  Location: [facility];  Service: Gynecology;  Laterality: Bilateral;   LAPAROSCOPIC OVARIAN CYSTECTOMY Left 10/09/2016   Procedure: LAPAROSCOPIC OVARIAN CYSTECTOMY;  Surgeon: Vena Austria, MD;  Location: [facility];  Service: Gynecology;  Laterality: Left;   mole removed  2016   area under breast removed   nsvd  2007     Current Outpatient Medications:    albuterol (VENTOLIN HFA) 108 (90 Base) MCG/ACT inhaler, Inhale 2 puffs into the lungs every 4 (four) hours as needed for wheezing or shortness of breath. (Patient not taking: Reported on 03/27/2020), Disp: 18 g, Rfl: 1   fluconazole (DIFLUCAN) 100 MG tablet, Take 200 mg (2 tablets) po daily x 1d, then take 100 mg (1 tab) po daily x 13 d for oral thrush (Patient not taking: Reported on 12/09/2021), Disp: 15 tablet, Rfl: 0   ketorolac (TORADOL) 10 MG tablet, Take 1 tablet (10 mg total) by mouth every 6 (six) hours as needed. (Patient not taking: Reported on 12/09/2021), Disp: 20 tablet, Rfl: 0   levocetirizine (XYZAL) 5 MG tablet, Take 1 tablet (5 mg total) by mouth every evening., Disp: 30 tablet, Rfl: 5   predniSONE (STERAPRED UNI-PAK 21 TAB) 10 MG (21) TBPK tablet, Take as directed on package.  (60 mg po on day 1, 50 mg po on day 2...) (Patient not taking: Reported on 08/05/2022), Disp: 21 tablet, Rfl: 0   tamsulosin (FLOMAX) 0.4 MG CAPS capsule, Take 1 capsule (0.4 mg total) by mouth daily., Disp: 30 capsule, Rfl: 3   tiZANidine (ZANAFLEX) 4 MG tablet, Take 1 tablet (4 mg total) by mouth every 8 (eight) hours as needed for muscle spasms (muscle tightness). (Patient not taking: Reported on 08/05/2022), Disp: 90  tablet, Rfl: 2  Allergies  Allergen Reactions   Cyclobenzaprine Itching   Mobic [Meloxicam] Anaphylaxis   Tramadol Hives   Amoxicillin Hives and Itching    Has patient had a PCN reaction causing immediate rash, facial/tongue/throat swelling, SOB or lightheadedness with hypotension: Yes Has patient had a PCN reaction causing severe rash involving mucus membranes or skin necrosis: No Has patient had a PCN reaction that required hospitalization: No Has patient had a PCN reaction occurring within the last 10 years: No If all of the above answers are "NO", then may proceed with Cephalosporin use.     Penicillins Hives, Itching and Rash    Has patient had a PCN reaction causing immediate rash, facial/tongue/throat swelling, SOB or lightheadedness with hypotension: Yes Has patient had a PCN reaction causing severe rash involving mucus membranes or skin necrosis: No Has patient had  a PCN reaction that required hospitalization: No Has patient had a PCN reaction occurring within the last 10 years: No If all of the above answers are "NO", then may proceed with Cephalosporin use.     Patient Care Team: Danelle Berry, PA-C as PCP - General (Family Medicine)   Chart Review: I personally reviewed active problem list, medication list, allergies, family history, social history, health maintenance, notes from last encounter, lab results, imaging with the patient/caregiver today.   Review of Systems  Constitutional: Negative.   HENT: Negative.    Eyes: Negative.   Respiratory: Negative.    Cardiovascular: Negative.   Gastrointestinal: Negative.   Endocrine: Negative.   Genitourinary: Negative.   Musculoskeletal: Negative.   Skin: Negative.   Allergic/Immunologic: Negative.   Neurological: Negative.   Hematological: Negative.   Psychiatric/Behavioral: Negative.    All other systems reviewed and are negative.         Objective:   Vitals:  Vitals:   12/23/22 1529  BP: 110/80   Pulse: 87  Resp: 16  Temp: 98.4 F (36.9 C)  TempSrc: Oral  SpO2: 100%  Weight: 146 lb 3.2 oz (66.3 kg)  Height: 5' 5.5" (1.664 m)    Body mass index is 23.96 kg/m.  Physical Exam Vitals and nursing note reviewed.  Constitutional:      General: She is not in acute distress.    Appearance: Normal appearance. She is well-developed. She is not ill-appearing, toxic-appearing or diaphoretic.  HENT:     Head: Normocephalic and atraumatic.     Right Ear: Tympanic membrane, ear canal and external ear normal. There is no impacted cerumen.     Left Ear: Tympanic membrane and external ear normal. There is no impacted cerumen.     Nose: Nose normal. No congestion or rhinorrhea.     Mouth/Throat:     Mouth: Mucous membranes are moist.     Pharynx: Oropharynx is clear. Uvula midline. No oropharyngeal exudate or posterior oropharyngeal erythema.  Eyes:     General: Lids are normal.     Conjunctiva/sclera: Conjunctivae normal.  Neck:     Trachea: Phonation normal. No tracheal deviation.  Cardiovascular:     Rate and Rhythm: Normal rate and regular rhythm.     Pulses: Normal pulses.          Radial pulses are 2+ on the right side and 2+ on the left side.       Posterior tibial pulses are 2+ on the right side and 2+ on the left side.     Heart sounds: Normal heart sounds. No murmur heard.    No friction rub. No gallop.  Pulmonary:     Effort: Pulmonary effort is normal. No respiratory distress.     Breath sounds: Normal breath sounds. No stridor. No wheezing, rhonchi or rales.  Chest:     Chest wall: No tenderness.  Abdominal:     General: Bowel sounds are normal. There is no distension.     Palpations: Abdomen is soft.     Tenderness: There is no abdominal tenderness. There is no guarding or rebound.  Musculoskeletal:     Cervical back: Normal range of motion and neck supple.  Lymphadenopathy:     Cervical: No cervical adenopathy.  Skin:    General: Skin is warm and dry.      Capillary Refill: Capillary refill takes less than 2 seconds.     Coloration: Skin is not pale.     Findings: Lesion (multiple  raised brown round lesions to abd) present. No rash.  Neurological:     Mental Status: She is alert and oriented to person, place, and time.     Motor: No abnormal muscle tone.     Gait: Gait normal.  Psychiatric:        Mood and Affect: Mood normal.        Speech: Speech normal.        Behavior: Behavior normal.       Fall Risk:    12/23/2022    3:19 PM 08/05/2022    2:35 PM 12/09/2021    1:42 PM 04/24/2020    3:26 PM 03/27/2020   11:15 AM  Fall Risk   Falls in the past year? 0 0 0 0 0  Number falls in past yr: 0 0 0 0 0  Injury with Fall? 0 0 0 0 0  Risk for fall due to : No Fall Risks      Follow up Falls prevention discussed;Education provided;Falls evaluation completed  Falls evaluation completed Falls evaluation completed Falls evaluation completed    Functional Status Survey: Is the patient deaf or have difficulty hearing?: No Does the patient have difficulty seeing, even when wearing glasses/contacts?: No Does the patient have difficulty concentrating, remembering, or making decisions?: No Does the patient have difficulty walking or climbing stairs?: No Does the patient have difficulty dressing or bathing?: No Does the patient have difficulty doing errands alone such as visiting a doctor's office or shopping?: No   Assessment & Plan:    CPE completed today  USPSTF grade A and B recommendations reviewed with patient; age-appropriate recommendations, preventive care, screening tests, etc discussed and encouraged; healthy living encouraged; see AVS for patient education given to patient  Discussed importance of 150 minutes of physical activity weekly, AHA exercise recommendations given to pt in AVS/handout  Discussed importance of healthy diet:  eating lean meats and proteins, avoiding trans fats and saturated fats, avoid simple sugars and  excessive carbs in diet, eat 6 servings of fruit/vegetables daily and drink plenty of water and avoid sweet beverages.    Recommended pt to do annual eye exam and routine dental exams/cleanings  Depression, alcohol, fall screening completed as documented above and per flowsheets  Advance Care planning information and packet discussed and offered today, encouraged pt to discuss with family members/spouse/partner/friends and complete Advanced directive packet and bring copy to office   Reviewed Health Maintenance: Health Maintenance  Topic Date Due   DTaP/Tdap/Td (2 - Td or Tdap) 02/10/2020   INFLUENZA VACCINE  12/11/2022   COVID-19 Vaccine (1) 01/08/2023 (Originally [birth year]/1988)   MAMMOGRAM  09/22/2023   Hepatitis C Screening  Completed   HIV Screening  Completed   HPV VACCINES  Aged Out   PAP SMEAR-Modifier  Discontinued    Immunizations: Immunization History  Administered Date(s) Administered   Tdap 02/09/2010   Vaccines:  HPV: up to at age 57 , ask insurance if age between 60-45  Shingrix: 19-64 yo and ask insurance if covered when patient above 71 yo Pneumonia: due- smoker- educated and discussed with patient. Flu: not in stock yet educated and discussed with patient. COVID:   Tetanus - due    ICD-10-CM   1. Annual physical exam  Z00.00 COMPLETE METABOLIC PANEL WITH GFR    CBC with Differential/Platelet    Lipid panel    Hemoglobin A1c    2. S/P laparoscopic hysterectomy  Z90.710     3. Fibrocystic changes of left breast  N60.12    she has f/up mammo scheduled    4. Tobacco abuse  Z72.0    she wants to discuss med options    5. Need for diphtheria-tetanus-pertussis (Tdap) vaccine  Z23 Tdap vaccine greater than or equal to 7yo IM   done today    6. Compound nevus  D22.9     7. Hx of malignant melanoma  Z85.820    will need to do referral again to dermatology - get records          Danelle Berry, PA-C 12/23/22 3:51 PM  [facility]

## 2023-01-11 DIAGNOSIS — Z419 Encounter for procedure for purposes other than remedying health state, unspecified: Secondary | ICD-10-CM | POA: Diagnosis not present

## 2023-02-10 DIAGNOSIS — Z419 Encounter for procedure for purposes other than remedying health state, unspecified: Secondary | ICD-10-CM | POA: Diagnosis not present

## 2023-02-28 ENCOUNTER — Encounter: Payer: Self-pay | Admitting: Family Medicine

## 2023-03-13 DIAGNOSIS — Z419 Encounter for procedure for purposes other than remedying health state, unspecified: Secondary | ICD-10-CM | POA: Diagnosis not present

## 2023-03-26 ENCOUNTER — Other Ambulatory Visit: Payer: Self-pay

## 2023-03-26 ENCOUNTER — Ambulatory Visit
Admission: RE | Admit: 2023-03-26 | Discharge: 2023-03-26 | Disposition: A | Discharge status: Home / Self Care | Payer: Medicaid Other | Source: Ambulatory Visit | Attending: Family Medicine | Admitting: Family Medicine

## 2023-03-26 ENCOUNTER — Other Ambulatory Visit: Payer: Self-pay | Admitting: Family Medicine

## 2023-03-26 DIAGNOSIS — N6012 Diffuse cystic mastopathy of left breast: Secondary | ICD-10-CM

## 2023-03-26 DIAGNOSIS — N6489 Other specified disorders of breast: Secondary | ICD-10-CM | POA: Insufficient documentation

## 2023-03-26 DIAGNOSIS — N644 Mastodynia: Secondary | ICD-10-CM | POA: Diagnosis not present

## 2023-03-26 DIAGNOSIS — R928 Other abnormal and inconclusive findings on diagnostic imaging of breast: Secondary | ICD-10-CM | POA: Diagnosis not present

## 2023-03-26 DIAGNOSIS — N6019 Diffuse cystic mastopathy of unspecified breast: Secondary | ICD-10-CM | POA: Diagnosis not present

## 2023-03-26 DIAGNOSIS — R92333 Mammographic heterogeneous density, bilateral breasts: Secondary | ICD-10-CM | POA: Diagnosis not present

## 2023-04-12 DIAGNOSIS — Z419 Encounter for procedure for purposes other than remedying health state, unspecified: Secondary | ICD-10-CM | POA: Diagnosis not present

## 2023-05-13 DIAGNOSIS — Z419 Encounter for procedure for purposes other than remedying health state, unspecified: Secondary | ICD-10-CM | POA: Diagnosis not present

## 2023-06-13 DIAGNOSIS — Z419 Encounter for procedure for purposes other than remedying health state, unspecified: Secondary | ICD-10-CM | POA: Diagnosis not present

## 2023-06-17 ENCOUNTER — Ambulatory Visit: Payer: Medicaid Other | Admitting: Family Medicine

## 2023-06-17 VITALS — BP 128/70 | HR 77 | Resp 16 | Ht 66.0 in | Wt 159.0 lb

## 2023-06-17 DIAGNOSIS — N644 Mastodynia: Secondary | ICD-10-CM | POA: Diagnosis not present

## 2023-06-17 DIAGNOSIS — Z90710 Acquired absence of both cervix and uterus: Secondary | ICD-10-CM | POA: Diagnosis not present

## 2023-06-17 DIAGNOSIS — N951 Menopausal and female climacteric states: Secondary | ICD-10-CM | POA: Diagnosis not present

## 2023-06-17 DIAGNOSIS — R7303 Prediabetes: Secondary | ICD-10-CM | POA: Diagnosis not present

## 2023-06-17 DIAGNOSIS — N6012 Diffuse cystic mastopathy of left breast: Secondary | ICD-10-CM | POA: Diagnosis not present

## 2023-06-17 DIAGNOSIS — R635 Abnormal weight gain: Secondary | ICD-10-CM

## 2023-06-17 NOTE — Patient Instructions (Addendum)
 Call to make sure you get the imaging is set up  [facility] [address], [place], [place] [address] Scheduling phone #: [phone]

## 2023-06-17 NOTE — Progress Notes (Signed)
 Patient ID: Taylor Schwartz, female    DOB: 03/12/1981, 43 y.o.   MRN: [id number]  PCP: Leavy Mole, PA-C  Chief Complaint  Patient presents with   Breast Pain    L side, x2 months. Feels like a bricks, burning and tingling. Pain radiates down side and into hip.    Subjective:   Taylor Schwartz is a 43 y.o. female, presents to clinic with CC of the following:  HPI  CC of left breast pain Hx of fibrocystic breast changes, past left breast biopsy benign, most recent diagnostic mammo and US  was in Nov Biopsy was Nov 2023 For the past couple months more pain, tenderness a 12 O'clock nodule Pain all the time and also sharp shooting pains intermittently w/o any discernable pattern S/p hysterectomy no cycles or monthly patter to it, more persistent    Weight changes, unsure about menopause/perimenopause with s/p hysterectomy  Wt Readings from Last 5 Encounters:  06/17/23 159 lb (72.1 kg)  12/23/22 146 lb 3.2 oz (66.3 kg)  08/05/22 151 lb 9.6 oz (68.8 kg)  12/09/21 139 lb 4.8 oz (63.2 kg)  04/24/20 140 lb 4.8 oz (63.6 kg)   BMI Readings from Last 5 Encounters:  06/17/23 25.66 kg/m  12/23/22 23.96 kg/m  08/05/22 23.74 kg/m  12/09/21 21.82 kg/m  04/24/20 21.97 kg/m   Lab Results  Component Value Date   HGBA1C 5.7 (H) 12/23/2022   Lab Results  Component Value Date   CHOL 180 12/23/2022   HDL 48 (L) 12/23/2022   LDLCALC 107 (H) 12/23/2022   TRIG 130 12/23/2022   CHOLHDL 3.8 12/23/2022       Patient Active Problem List   Diagnosis Date Noted   Lumbar radiculopathy 10/27/2018   Anxiety 05/13/2018   Poor sleep 05/13/2018   Hx of malignant melanoma 05/07/2018   S/P laparoscopic hysterectomy 07/16/2017   Endometriosis determined by laparoscopy 10/16/2016   Diarrhea 07/04/2016   Abdominal pain 07/04/2016   Complex cyst of left ovary 07/04/2016   Tobacco abuse 07/04/2016   Fibrocystic changes of left breast 01/28/2016   Cervical radiculopathy  07/06/2015   Compound nevus 07/06/2015   Esophageal reflux 07/06/2015   Recurrent major depression (HCC) 07/06/2015   Depression with anxiety 10/17/2014     No current outpatient medications on file.   Allergies  Allergen Reactions   Cyclobenzaprine Itching   Mobic [Meloxicam] Anaphylaxis   Tramadol Hives   Amoxicillin Hives and Itching    Has patient had a PCN reaction causing immediate rash, facial/tongue/throat swelling, SOB or lightheadedness with hypotension: Yes Has patient had a PCN reaction causing severe rash involving mucus membranes or skin necrosis: No Has patient had a PCN reaction that required hospitalization: No Has patient had a PCN reaction occurring within the last 10 years: No If all of the above answers are NO, then may proceed with Cephalosporin use.     Penicillins Hives, Itching and Rash    Has patient had a PCN reaction causing immediate rash, facial/tongue/throat swelling, SOB or lightheadedness with hypotension: Yes Has patient had a PCN reaction causing severe rash involving mucus membranes or skin necrosis: No Has patient had a PCN reaction that required hospitalization: No Has patient had a PCN reaction occurring within the last 10 years: No If all of the above answers are NO, then may proceed with Cephalosporin use.      Social History   Tobacco Use   Smoking status: Every Day    Current packs/day: 0.50  Types: Cigarettes   Smokeless tobacco: Never  Vaping Use   Vaping status: Never Used  Substance Use Topics   Alcohol use: No   Drug use: No      Chart Review Today: I personally reviewed active problem list, medication list, allergies, family history, social history, health maintenance, notes from last encounter, lab results, imaging with the patient/caregiver today.   Review of Systems  Constitutional:  Positive for fatigue and unexpected weight change. Negative for activity change, appetite change, chills, diaphoresis and  fever.  HENT: Negative.    Eyes: Negative.   Respiratory: Negative.    Cardiovascular: Negative.   Gastrointestinal: Negative.   Endocrine: Negative.   Genitourinary: Negative.   Musculoskeletal: Negative.   Skin: Negative.   Allergic/Immunologic: Negative.   Neurological: Negative.   Hematological: Negative.   Psychiatric/Behavioral: Negative.    All other systems reviewed and are negative.      Objective:   Vitals:   06/17/23 1001  BP: 128/70  Pulse: 77  Resp: 16  SpO2: 99%  Weight: 159 lb (72.1 kg)  Height: 5' 6 (1.676 m)    Body mass index is 25.66 kg/m.  Physical Exam Vitals and nursing note reviewed.  Constitutional:      General: She is not in acute distress.    Appearance: Normal appearance. She is well-developed. She is not ill-appearing, toxic-appearing or diaphoretic.  HENT:     Head: Normocephalic and atraumatic.     Nose: Nose normal.  Eyes:     General:        Right eye: No discharge.        Left eye: No discharge.     Conjunctiva/sclera: Conjunctivae normal.  Neck:     Trachea: No tracheal deviation.  Cardiovascular:     Rate and Rhythm: Normal rate and regular rhythm.  Pulmonary:     Effort: Pulmonary effort is normal. No respiratory distress.     Breath sounds: No stridor.  Chest:     Chest wall: No mass, lacerations, swelling, tenderness or edema.  Breasts:    Right: Normal.     Left: No swelling, bleeding, inverted nipple, nipple discharge, skin change or tenderness.     Comments: Dense breast tissue  Musculoskeletal:        General: Normal range of motion.  Lymphadenopathy:     Upper Body:     Right upper body: No supraclavicular, axillary or pectoral adenopathy.     Left upper body: No supraclavicular, axillary or pectoral adenopathy.  Skin:    General: Skin is warm and dry.     Findings: No rash.  Neurological:     Mental Status: She is alert.     Motor: No abnormal muscle tone.     Coordination: Coordination normal.   Psychiatric:        Behavior: Behavior normal.      Results for orders placed or performed in visit on 12/23/22  COMPLETE METABOLIC PANEL WITH GFR   Collection Time: 12/23/22  3:44 PM  Result Value Ref Range   Glucose, Bld 85 65 - 99 mg/dL   BUN 22 7 - 25 mg/dL   Creat 9.34 9.49 - 9.02 mg/dL   eGFR 884 > OR = 60 fO/fpw/8.26f7   BUN/Creatinine Ratio SEE NOTE: 6 - 22 (calc)   Sodium 139 135 - 146 mmol/L   Potassium 4.4 3.5 - 5.3 mmol/L   Chloride 108 98 - 110 mmol/L   CO2 24 20 - 32 mmol/L  Calcium 9.8 8.6 - 10.2 mg/dL   Total Protein 7.1 6.1 - 8.1 g/dL   Albumin 4.7 3.6 - 5.1 g/dL   Globulin 2.4 1.9 - 3.7 g/dL (calc)   AG Ratio 2.0 1.0 - 2.5 (calc)   Total Bilirubin 0.5 0.2 - 1.2 mg/dL   Alkaline phosphatase (APISO) 51 31 - 125 U/L   AST 22 10 - 30 U/L   ALT 21 6 - 29 U/L  CBC with Differential/Platelet   Collection Time: 12/23/22  3:44 PM  Result Value Ref Range   WBC 9.4 3.8 - 10.8 Thousand/uL   RBC 4.49 3.80 - 5.10 Million/uL   Hemoglobin 13.6 11.7 - 15.5 g/dL   HCT 59.5 64.9 - 54.9 %   MCV 90.0 80.0 - 100.0 fL   MCH 30.3 27.0 - 33.0 pg   MCHC 33.7 32.0 - 36.0 g/dL   RDW 87.4 88.9 - 84.9 %   Platelets 282 140 - 400 Thousand/uL   MPV 10.5 7.5 - 12.5 fL   Neutro Abs 6,298 1,500 - 7,800 cells/uL   Lymphs Abs 2,049 850 - 3,900 cells/uL   Absolute Monocytes 855 200 - 950 cells/uL   Eosinophils Absolute 150 15 - 500 cells/uL   Basophils Absolute 47 0 - 200 cells/uL   Neutrophils Relative % 67 %   Total Lymphocyte 21.8 %   Monocytes Relative 9.1 %   Eosinophils Relative 1.6 %   Basophils Relative 0.5 %  Lipid panel   Collection Time: 12/23/22  3:44 PM  Result Value Ref Range   Cholesterol 180 <200 mg/dL   HDL 48 (L) > OR = 50 mg/dL   Triglycerides 869 <849 mg/dL   LDL Cholesterol (Calc) 107 (H) mg/dL (calc)   Total CHOL/HDL Ratio 3.8 <5.0 (calc)   Non-HDL Cholesterol (Calc) 132 (H) <130 mg/dL (calc)  Hemoglobin J8r   Collection Time: 12/23/22  3:44 PM   Result Value Ref Range   Hgb A1c MFr Bld 5.7 (H) <5.7 % of total Hgb   Mean Plasma Glucose 117 mg/dL   eAG (mmol/L) 6.5 mmol/L       Assessment & Plan:   1. 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 Unintended weight gain Concerned about 15 lb weight change w/o change to diet/lifestyle, very active R/o hypothyroid Recent prediabetes may be metabolic dysfunction - TSH - T4, free  3. Prediabetes A1C in prediabetes range in August - not on meds, she's gained weight since then, recheck labs - Hemoglobin A1c - COMPLETE METABOLIC PANEL WITH GFR  4. Perimenopausal symptoms S/p hysterectomy will check hormones - Follicle stimulating hormone - Luteinizing hormone - Prolactin - Estrogens , Total  5. Fibrocystic changes of left breast See #1  6. S/P laparoscopic hysterectomy  - Follicle stimulating hormone - Luteinizing hormone - Prolactin - Estrogens , Total      Michelene Cower, PA-C 06/17/23 10:33 AM

## 2023-06-20 LAB — COMPLETE METABOLIC PANEL WITH GFR
AG Ratio: 1.7 (calc) (ref 1.0–2.5)
ALT: 18 U/L (ref 6–29)
AST: 22 U/L (ref 10–30)
Albumin: 4.6 g/dL (ref 3.6–5.1)
Alkaline phosphatase (APISO): 46 U/L (ref 31–125)
BUN: 14 mg/dL (ref 7–25)
CO2: 25 mmol/L (ref 20–32)
Calcium: 10 mg/dL (ref 8.6–10.2)
Chloride: 108 mmol/L (ref 98–110)
Creat: 0.66 mg/dL (ref 0.50–0.99)
Globulin: 2.7 g/dL (ref 1.9–3.7)
Glucose, Bld: 79 mg/dL (ref 65–99)
Potassium: 4.3 mmol/L (ref 3.5–5.3)
Sodium: 140 mmol/L (ref 135–146)
Total Bilirubin: 0.4 mg/dL (ref 0.2–1.2)
Total Protein: 7.3 g/dL (ref 6.1–8.1)
eGFR: 114 mL/min/{1.73_m2} (ref >=60)

## 2023-06-20 LAB — LUTEINIZING HORMONE: LH: 3.4 m[IU]/mL

## 2023-06-20 LAB — PROLACTIN: Prolactin: 5.9 ng/mL

## 2023-06-20 LAB — T4, FREE: Free T4: 1.1 ng/dL (ref 0.8–1.8)

## 2023-06-20 LAB — HEMOGLOBIN A1C
Hgb A1c MFr Bld: 5.8 %{Hb} — ABNORMAL HIGH (ref <5.7)
Mean Plasma Glucose: 120 mg/dL
eAG (mmol/L): 6.6 mmol/L

## 2023-06-20 LAB — ESTROGENS, TOTAL: Estrogen: 190 pg/mL

## 2023-06-20 LAB — FOLLICLE STIMULATING HORMONE: FSH: 5.3 m[IU]/mL

## 2023-06-20 LAB — TSH: TSH: 0.81 m[IU]/L

## 2023-06-26 ENCOUNTER — Ambulatory Visit
Admission: RE | Admit: 2023-06-26 | Discharge: 2023-06-26 | Disposition: A | Discharge status: Home / Self Care | Payer: Medicaid Other | Source: Ambulatory Visit | Attending: Family Medicine | Admitting: Family Medicine

## 2023-06-26 DIAGNOSIS — N644 Mastodynia: Secondary | ICD-10-CM | POA: Diagnosis not present

## 2023-06-26 DIAGNOSIS — N6452 Nipple discharge: Secondary | ICD-10-CM | POA: Diagnosis not present

## 2023-06-26 DIAGNOSIS — N6321 Unspecified lump in the left breast, upper outer quadrant: Secondary | ICD-10-CM | POA: Diagnosis not present

## 2023-06-26 DIAGNOSIS — R92333 Mammographic heterogeneous density, bilateral breasts: Secondary | ICD-10-CM | POA: Diagnosis not present

## 2023-07-11 DIAGNOSIS — Z419 Encounter for procedure for purposes other than remedying health state, unspecified: Secondary | ICD-10-CM | POA: Diagnosis not present

## 2023-08-22 DIAGNOSIS — Z419 Encounter for procedure for purposes other than remedying health state, unspecified: Secondary | ICD-10-CM | POA: Diagnosis not present

## 2023-09-17 ENCOUNTER — Ambulatory Visit: Admitting: Family Medicine

## 2023-09-17 ENCOUNTER — Encounter: Payer: Self-pay | Admitting: Family Medicine

## 2023-09-17 ENCOUNTER — Ambulatory Visit

## 2023-09-17 ENCOUNTER — Telehealth: Payer: Self-pay

## 2023-09-17 VITALS — BP 120/82 | HR 80 | Resp 16 | Ht 66.0 in | Wt 152.0 lb

## 2023-09-17 DIAGNOSIS — R109 Unspecified abdominal pain: Secondary | ICD-10-CM | POA: Diagnosis not present

## 2023-09-17 DIAGNOSIS — R110 Nausea: Secondary | ICD-10-CM

## 2023-09-17 DIAGNOSIS — R1012 Left upper quadrant pain: Secondary | ICD-10-CM | POA: Diagnosis not present

## 2023-09-17 LAB — POCT URINALYSIS DIPSTICK
Appearance: NORMAL
Bilirubin, UA: NEGATIVE
Blood, UA: NEGATIVE
Glucose, UA: NEGATIVE
Ketones, UA: NEGATIVE
Leukocytes, UA: NEGATIVE
Nitrite, UA: NEGATIVE
Protein, UA: NEGATIVE
Spec Grav, UA: 1.01 (ref 1.010–1.025)
Urobilinogen, UA: 0.2 U/dL
pH, UA: 7 (ref 5.0–8.0)

## 2023-09-17 MED ORDER — ONDANSETRON 4 MG PO TBDP: 4.0000 mg | ORAL_TABLET | Freq: Three times a day (TID) | ORAL | 2 refills | Status: AC | PRN

## 2023-09-17 MED ORDER — PANTOPRAZOLE SODIUM 40 MG PO TBEC: 40.0000 mg | DELAYED_RELEASE_TABLET | Freq: Every day | ORAL | 3 refills | Status: AC

## 2023-09-17 NOTE — Progress Notes (Signed)
 Patient ID: Taylor Schwartz, female    DOB: 08-Aug-1980, 43 y.o.   MRN: [id number]  PCP: Adeline Hone, PA-C  Chief Complaint  Patient presents with   Abdominal Pain    X3 weeks. "Throbbing" "sometimes sharp", L side of belly button radiating to back. Worse after eating.    Subjective:   Taylor Schwartz is a 43 y.o. female, presents to clinic with CC of the following:  Abdominal Pain This is a new problem. Episode onset: 3 weeks. The onset quality is sudden. The problem occurs constantly. The problem has been gradually worsening. The pain is located in the LUQ and left flank. The pain is severe. The quality of the pain is aching (stabbing). Associated symptoms include nausea. Pertinent negatives include no constipation, diarrhea, dysuria, fever, frequency, hematochezia, hematuria, melena, vomiting or weight loss. The pain is aggravated by eating and movement. She has tried nothing for the symptoms.      Patient Active Problem List   Diagnosis Date Noted   Lumbar radiculopathy 10/27/2018   Anxiety 05/13/2018   Poor sleep 05/13/2018   Hx of malignant melanoma 05/07/2018   S/P laparoscopic hysterectomy 07/16/2017   Endometriosis determined by laparoscopy 10/16/2016   Diarrhea 07/04/2016   Abdominal pain 07/04/2016   Complex cyst of left ovary 07/04/2016   Tobacco abuse 07/04/2016   Fibrocystic changes of left breast 01/28/2016   Cervical radiculopathy 07/06/2015   Compound nevus 07/06/2015   Esophageal reflux 07/06/2015   Recurrent major depression (HCC) 07/06/2015   Depression with anxiety 10/17/2014      Current Outpatient Medications:    ondansetron  (ZOFRAN -ODT) 4 MG disintegrating tablet, Take 1-2 tablets (4-8 mg total) by mouth every 8 (eight) hours as needed for nausea or vomiting., Disp: 20 tablet, Rfl: 2   pantoprazole (PROTONIX) 40 MG tablet, Take 1 tablet (40 mg total) by mouth daily., Disp: 30 tablet, Rfl: 3   Allergies  Allergen Reactions    Cyclobenzaprine Itching   Mobic [Meloxicam] Anaphylaxis   Tramadol Hives   Amoxicillin Hives and Itching    Has patient had a PCN reaction causing immediate rash, facial/tongue/throat swelling, SOB or lightheadedness with hypotension: Yes Has patient had a PCN reaction causing severe rash involving mucus membranes or skin necrosis: No Has patient had a PCN reaction that required hospitalization: No Has patient had a PCN reaction occurring within the last 10 years: No If all of the above answers are "NO", then may proceed with Cephalosporin use.     Penicillins Hives, Itching and Rash    Has patient had a PCN reaction causing immediate rash, facial/tongue/throat swelling, SOB or lightheadedness with hypotension: Yes Has patient had a PCN reaction causing severe rash involving mucus membranes or skin necrosis: No Has patient had a PCN reaction that required hospitalization: No Has patient had a PCN reaction occurring within the last 10 years: No If all of the above answers are "NO", then may proceed with Cephalosporin use.      Social History   Tobacco Use   Smoking status: Every Day    Current packs/day: 0.50    Types: Cigarettes   Smokeless tobacco: Never  Vaping Use   Vaping status: Never Used  Substance Use Topics   Alcohol use: No   Drug use: No      Chart Review Today: I personally reviewed active problem list, medication list, allergies, family history, social history, health maintenance, notes from last encounter, lab results, imaging with the patient/caregiver today.  Review of Systems  Constitutional: Negative.  Negative for fever and weight loss.  HENT: Negative.    Eyes: Negative.   Respiratory: Negative.    Cardiovascular: Negative.   Gastrointestinal:  Positive for abdominal pain and nausea. Negative for constipation, diarrhea, hematochezia, melena and vomiting.  Endocrine: Negative.   Genitourinary: Negative.  Negative for dysuria, frequency and  hematuria.  Musculoskeletal: Negative.   Skin: Negative.   Allergic/Immunologic: Negative.   Neurological: Negative.   Hematological: Negative.   Psychiatric/Behavioral: Negative.    All other systems reviewed and are negative.      Objective:   Vitals:   09/17/23 0952  BP: 120/82  Pulse: 80  Resp: 16  SpO2: 96%  Weight: 152 lb (68.9 kg)  Height: 5\' 6"  (1.676 m)    Body mass index is 24.53 kg/m.  Physical Exam Vitals and nursing note reviewed.  Constitutional:      Appearance: Normal appearance. She is well-developed.  HENT:     Head: Normocephalic and atraumatic.     Nose: Nose normal.  Eyes:     General: No scleral icterus.       Right eye: No discharge.        Left eye: No discharge.     Conjunctiva/sclera: Conjunctivae normal.  Neck:     Trachea: No tracheal deviation.  Cardiovascular:     Rate and Taylor: Normal rate and regular Taylor.     Pulses: Normal pulses.     Heart sounds: Normal heart sounds.  Pulmonary:     Effort: Pulmonary effort is normal. No respiratory distress.     Breath sounds: Normal breath sounds. No stridor.  Abdominal:     General: Abdomen is flat.     Palpations: Abdomen is soft. There is no hepatomegaly, splenomegaly or mass.     Tenderness: There is abdominal tenderness in the left upper quadrant. There is left CVA tenderness and guarding. There is no right CVA tenderness.     Hernia: No hernia is present.  Musculoskeletal:        General: Normal range of motion.  Skin:    General: Skin is warm and dry.     Coloration: Skin is not jaundiced or pale.     Findings: No rash.  Neurological:     Mental Status: She is alert.     Motor: No abnormal muscle tone.     Coordination: Coordination normal.  Psychiatric:        Behavior: Behavior normal.      Results for orders placed or performed in visit on 06/17/23  TSH   Collection Time: 06/17/23 10:47 AM  Result Value Ref Range   TSH 0.81 mIU/L  Hemoglobin A1c   Collection  Time: 06/17/23 10:47 AM  Result Value Ref Range   Hgb A1c MFr Bld 5.8 (H) <5.7 % of total Hgb   Mean Plasma Glucose 120 mg/dL   eAG (mmol/L) 6.6 mmol/L  COMPLETE METABOLIC PANEL WITH GFR   Collection Time: 06/17/23 10:47 AM  Result Value Ref Range   Glucose, Bld 79 65 - 99 mg/dL   BUN 14 7 - 25 mg/dL   Creat 4.09 8.11 - 9.14 mg/dL   eGFR 782 > OR = 60 NF/AOZ/3.08M5   BUN/Creatinine Ratio SEE NOTE: 6 - 22 (calc)   Sodium 140 135 - 146 mmol/L   Potassium 4.3 3.5 - 5.3 mmol/L   Chloride 108 98 - 110 mmol/L   CO2 25 20 - 32 mmol/L   Calcium 10.0  8.6 - 10.2 mg/dL   Total Protein 7.3 6.1 - 8.1 g/dL   Albumin 4.6 3.6 - 5.1 g/dL   Globulin 2.7 1.9 - 3.7 g/dL (calc)   AG Ratio 1.7 1.0 - 2.5 (calc)   Total Bilirubin 0.4 0.2 - 1.2 mg/dL   Alkaline phosphatase (APISO) 46 31 - 125 U/L   AST 22 10 - 30 U/L   ALT 18 6 - 29 U/L  T4, free   Collection Time: 06/17/23 10:47 AM  Result Value Ref Range   Free T4 1.1 0.8 - 1.8 ng/dL  Follicle stimulating hormone   Collection Time: 06/17/23 10:47 AM  Result Value Ref Range   FSH 5.3 mIU/mL  Luteinizing hormone   Collection Time: 06/17/23 10:47 AM  Result Value Ref Range   LH 3.4 mIU/mL  Prolactin   Collection Time: 06/17/23 10:47 AM  Result Value Ref Range   Prolactin 5.9 ng/mL  Estrogens , Total   Collection Time: 06/17/23 10:47 AM  Result Value Ref Range   Estrogen 190 pg/mL       Assessment & Plan:   1. LUQ abdominal pain (Primary) Pain x 3 weeks worsening left flank to LUQ exacebated with eating Concern with CVA ttp and LUQ tenderness on exam today, Ddx pyelo, nephrolithiasis, pancreatitis, gastric ulcer, labs, urine and stat CT - CBC with Differential/Platelet - Comprehensive metabolic panel with GFR - Lipase - Urine Culture - pantoprazole (PROTONIX) 40 MG tablet; Take 1 tablet (40 mg total) by mouth daily.  Dispense: 30 tablet; Refill: 3 - CT ABDOMEN PELVIS WO CONTRAST  2. Left flank pain See #1 - CBC with  Differential/Platelet - Comprehensive metabolic panel with GFR - Lipase - Urine Culture - Urine Microscopic - CT ABDOMEN PELVIS WO CONTRAST  3. Postprandial abdominal pain in left upper quadrant Cover with PPI, pepcid  BID PRN - pantoprazole (PROTONIX) 40 MG tablet; Take 1 tablet (40 mg total) by mouth daily.  Dispense: 30 tablet; Refill: 3 - CT ABDOMEN PELVIS WO CONTRAST  4. Nausea See #1, no vomiting but nauseated often and eating, no anorexia or weight loss - pantoprazole (PROTONIX) 40 MG tablet; Take 1 tablet (40 mg total) by mouth daily.  Dispense: 30 tablet; Refill: 3 - ondansetron  (ZOFRAN -ODT) 4 MG disintegrating tablet; Take 1-2 tablets (4-8 mg total) by mouth every 8 (eight) hours as needed for nausea or vomiting.  Dispense: 20 tablet; Refill: 2 - CT ABDOMEN PELVIS WO CONTRAST     Adeline Hone, PA-C 09/17/23 10:35 AM

## 2023-09-17 NOTE — Telephone Encounter (Signed)
 CT APPROVAL:  Status: Approved  Request ID: [id number]  Tracking: [id number]  Validity Dates: 09/17/2023-11/16/2023

## 2023-09-18 ENCOUNTER — Encounter: Payer: Self-pay | Admitting: Family Medicine

## 2023-09-18 ENCOUNTER — Ambulatory Visit
Admission: RE | Admit: 2023-09-18 | Discharge: 2023-09-18 | Disposition: A | Discharge status: Home / Self Care | Source: Ambulatory Visit | Attending: Family Medicine | Admitting: Family Medicine

## 2023-09-18 DIAGNOSIS — R1012 Left upper quadrant pain: Secondary | ICD-10-CM | POA: Insufficient documentation

## 2023-09-18 DIAGNOSIS — R110 Nausea: Secondary | ICD-10-CM | POA: Insufficient documentation

## 2023-09-18 DIAGNOSIS — Z90710 Acquired absence of both cervix and uterus: Secondary | ICD-10-CM | POA: Diagnosis not present

## 2023-09-18 DIAGNOSIS — Z9049 Acquired absence of other specified parts of digestive tract: Secondary | ICD-10-CM | POA: Diagnosis not present

## 2023-09-18 DIAGNOSIS — R109 Unspecified abdominal pain: Secondary | ICD-10-CM | POA: Diagnosis not present

## 2023-09-18 DIAGNOSIS — N200 Calculus of kidney: Secondary | ICD-10-CM | POA: Diagnosis not present

## 2023-09-18 LAB — URINE CULTURE
MICRO NUMBER:: 16431727
Result:: NO GROWTH
SPECIMEN QUALITY:: ADEQUATE

## 2023-09-18 LAB — CBC WITH DIFFERENTIAL/PLATELET
Absolute Lymphocytes: 1602 {cells}/uL (ref 850–3900)
Absolute Monocytes: 863 {cells}/uL (ref 200–950)
Basophils Absolute: 80 {cells}/uL (ref 0–200)
Basophils Relative: 0.9 %
Eosinophils Absolute: 160 {cells}/uL (ref 15–500)
Eosinophils Relative: 1.8 %
HCT: 41.3 % (ref 35.0–45.0)
Hemoglobin: 13.6 g/dL (ref 11.7–15.5)
MCH: 29.8 pg (ref 27.0–33.0)
MCHC: 32.9 g/dL (ref 32.0–36.0)
MCV: 90.6 fL (ref 80.0–100.0)
MPV: 10.5 fL (ref 7.5–12.5)
Monocytes Relative: 9.7 %
Neutro Abs: 6194 {cells}/uL (ref 1500–7800)
Neutrophils Relative %: 69.6 %
Platelets: 291 10*3/uL (ref 140–400)
RBC: 4.56 10*6/uL (ref 3.80–5.10)
RDW: 12.8 % (ref 11.0–15.0)
Total Lymphocyte: 18 %
WBC: 8.9 10*3/uL (ref 3.8–10.8)

## 2023-09-18 LAB — COMPREHENSIVE METABOLIC PANEL WITH GFR
AG Ratio: 1.6 (calc) (ref 1.0–2.5)
ALT: 10 U/L (ref 6–29)
AST: 14 U/L (ref 10–30)
Albumin: 4.4 g/dL (ref 3.6–5.1)
Alkaline phosphatase (APISO): 43 U/L (ref 31–125)
BUN: 9 mg/dL (ref 7–25)
CO2: 27 mmol/L (ref 20–32)
Calcium: 10.1 mg/dL (ref 8.6–10.2)
Chloride: 106 mmol/L (ref 98–110)
Creat: 0.83 mg/dL (ref 0.50–0.99)
Globulin: 2.7 g/dL (ref 1.9–3.7)
Glucose, Bld: 95 mg/dL (ref 65–99)
Potassium: 4.9 mmol/L (ref 3.5–5.3)
Sodium: 139 mmol/L (ref 135–146)
Total Bilirubin: 0.4 mg/dL (ref 0.2–1.2)
Total Protein: 7.1 g/dL (ref 6.1–8.1)
eGFR: 91 mL/min/{1.73_m2} (ref >=60)

## 2023-09-18 LAB — URINALYSIS, MICROSCOPIC ONLY
Bacteria, UA: NONE SEEN /HPF
Hyaline Cast: NONE SEEN /LPF
RBC / HPF: NONE SEEN /HPF (ref 0–2)
WBC, UA: NONE SEEN /HPF (ref 0–5)

## 2023-09-18 LAB — LIPASE: Lipase: 23 U/L (ref 7–60)

## 2023-09-21 ENCOUNTER — Encounter: Payer: Self-pay | Admitting: Family Medicine

## 2023-09-21 ENCOUNTER — Other Ambulatory Visit: Payer: Self-pay

## 2023-09-21 ENCOUNTER — Ambulatory Visit: Admitting: Family Medicine

## 2023-09-21 DIAGNOSIS — Z419 Encounter for procedure for purposes other than remedying health state, unspecified: Secondary | ICD-10-CM | POA: Diagnosis not present

## 2023-09-21 DIAGNOSIS — Z111 Encounter for screening for respiratory tuberculosis: Secondary | ICD-10-CM | POA: Diagnosis not present

## 2023-09-23 ENCOUNTER — Ambulatory Visit: Payer: Self-pay | Admitting: Family Medicine

## 2023-09-23 LAB — QUANTIFERON-TB GOLD PLUS
Mitogen-NIL: 4.9 [IU]/mL
NIL: 0.02 [IU]/mL
QuantiFERON-TB Gold Plus: NEGATIVE
TB1-NIL: 0.02 [IU]/mL
TB2-NIL: <0 [IU]/mL

## 2023-09-23 NOTE — Progress Notes (Unsigned)
 Labs only, no office visit

## 2023-10-22 DIAGNOSIS — Z419 Encounter for procedure for purposes other than remedying health state, unspecified: Secondary | ICD-10-CM | POA: Diagnosis not present

## 2023-11-21 DIAGNOSIS — Z419 Encounter for procedure for purposes other than remedying health state, unspecified: Secondary | ICD-10-CM | POA: Diagnosis not present

## 2023-12-22 DIAGNOSIS — Z419 Encounter for procedure for purposes other than remedying health state, unspecified: Secondary | ICD-10-CM | POA: Diagnosis not present

## 2024-01-22 DIAGNOSIS — Z419 Encounter for procedure for purposes other than remedying health state, unspecified: Secondary | ICD-10-CM | POA: Diagnosis not present

## 2024-03-02 ENCOUNTER — Telehealth: Payer: Self-pay

## 2024-03-02 ENCOUNTER — Other Ambulatory Visit: Payer: Self-pay

## 2024-03-02 DIAGNOSIS — R1012 Left upper quadrant pain: Secondary | ICD-10-CM

## 2024-03-02 NOTE — Telephone Encounter (Signed)
 Copied from CRM [id number]. Topic: Clinical - Request for Lab/Test Order >> Mar 01, 2024  5:00 PM Carla L wrote: Reason for CRM: Patient informed by imaging center that she is due for her mammogram. Patient has to get a special type of mammogram that includes an ultrasound, due to findings of PASH.   Patient informed by imaging she needs an authorization for this type of mammogram, patient requesting authorization to be completed.   Mammogram not due until November.

## 2024-03-03 ENCOUNTER — Other Ambulatory Visit: Payer: Self-pay | Admitting: Family Medicine

## 2024-03-03 DIAGNOSIS — R1012 Left upper quadrant pain: Secondary | ICD-10-CM

## 2024-03-17 ENCOUNTER — Encounter

## 2024-03-17 DIAGNOSIS — Z1231 Encounter for screening mammogram for malignant neoplasm of breast: Secondary | ICD-10-CM

## 2024-05-03 ENCOUNTER — Other Ambulatory Visit: Payer: Self-pay

## 2024-05-03 ENCOUNTER — Emergency Department

## 2024-05-03 ENCOUNTER — Emergency Department
Admission: EM | Admit: 2024-05-03 | Discharge: 2024-05-03 | Disposition: A | Discharge status: Home / Self Care | Attending: Emergency Medicine | Admitting: Emergency Medicine

## 2024-05-03 DIAGNOSIS — S0990XA Unspecified injury of head, initial encounter: Secondary | ICD-10-CM | POA: Insufficient documentation

## 2024-05-03 DIAGNOSIS — M79601 Pain in right arm: Secondary | ICD-10-CM | POA: Insufficient documentation

## 2024-05-03 DIAGNOSIS — S7011XA Contusion of right thigh, initial encounter: Secondary | ICD-10-CM | POA: Diagnosis not present

## 2024-05-03 DIAGNOSIS — M940 Chondrocostal junction syndrome [Tietze]: Secondary | ICD-10-CM | POA: Diagnosis not present

## 2024-05-03 DIAGNOSIS — R0789 Other chest pain: Secondary | ICD-10-CM

## 2024-05-03 DIAGNOSIS — Y92410 Unspecified street and highway as the place of occurrence of the external cause: Secondary | ICD-10-CM | POA: Insufficient documentation

## 2024-05-03 DIAGNOSIS — S79921A Unspecified injury of right thigh, initial encounter: Secondary | ICD-10-CM | POA: Diagnosis present

## 2024-05-03 LAB — POC URINE PREG, ED: Preg Test, Ur: NEGATIVE

## 2024-05-03 NOTE — Discharge Instructions (Addendum)
 Please use ibuprofen (Motrin) up to 800 mg every 8 hours, naproxen (Naprosyn) up to 500 mg every 12 hours, and/or acetaminophen (Tylenol) up to 4 g/day for any continued pain.  Please do not use this medication regimen for longer than 7 days
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
## 2024-05-10 ENCOUNTER — Ambulatory Visit: Payer: Self-pay
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
